# Patient Record
Sex: Female | Born: 1950 | Race: White | Hispanic: No | Marital: Married | Smoking: Never smoker
Health system: Southern US, Community
[De-identification: ages and names within clinical notes are randomized; demographics above are authoritative.]

## PROBLEM LIST (undated history)

## (undated) DIAGNOSIS — G4700 Insomnia, unspecified: Secondary | ICD-10-CM

## (undated) DIAGNOSIS — D649 Anemia, unspecified: Secondary | ICD-10-CM

## (undated) DIAGNOSIS — E7800 Pure hypercholesterolemia, unspecified: Secondary | ICD-10-CM

## (undated) DIAGNOSIS — Z780 Asymptomatic menopausal state: Secondary | ICD-10-CM

## (undated) DIAGNOSIS — F419 Anxiety disorder, unspecified: Secondary | ICD-10-CM

## (undated) DIAGNOSIS — E559 Vitamin D deficiency, unspecified: Secondary | ICD-10-CM

## (undated) DIAGNOSIS — M810 Age-related osteoporosis without current pathological fracture: Secondary | ICD-10-CM

## (undated) DIAGNOSIS — J302 Other seasonal allergic rhinitis: Secondary | ICD-10-CM

## (undated) HISTORY — DX: Vitamin D deficiency, unspecified: E55.9

## (undated) HISTORY — DX: Insomnia, unspecified: G47.00

## (undated) HISTORY — DX: Anxiety disorder, unspecified: F41.9

## (undated) HISTORY — DX: Asymptomatic menopausal state: Z78.0

## (undated) HISTORY — DX: Anemia, unspecified: D64.9

## (undated) HISTORY — PX: BLADDER SUSPENSION: SHX72

## (undated) HISTORY — DX: Age-related osteoporosis without current pathological fracture: M81.0

## (undated) HISTORY — PX: APPENDECTOMY: SHX54

## (undated) HISTORY — PX: BUNIONECTOMY: SHX129

## (undated) HISTORY — PX: TONSILLECTOMY: SUR1361

## (undated) HISTORY — DX: Other seasonal allergic rhinitis: J30.2

## (undated) HISTORY — DX: Pure hypercholesterolemia, unspecified: E78.00

---

## 1990-12-27 HISTORY — PX: ABDOMINAL HYSTERECTOMY: SHX81

## 1999-12-28 HISTORY — PX: LASIK: SHX215

## 2004-12-14 ENCOUNTER — Other Ambulatory Visit: Admission: RE | Admit: 2004-12-14 | Discharge: 2004-12-14 | Payer: Self-pay | Admitting: Family Medicine

## 2005-05-10 ENCOUNTER — Ambulatory Visit: Payer: Self-pay | Admitting: Internal Medicine

## 2005-06-02 ENCOUNTER — Ambulatory Visit (HOSPITAL_BASED_OUTPATIENT_CLINIC_OR_DEPARTMENT_OTHER): Admission: RE | Admit: 2005-06-02 | Discharge: 2005-06-02 | Payer: Self-pay | Admitting: Urology

## 2005-06-02 ENCOUNTER — Ambulatory Visit (HOSPITAL_COMMUNITY): Admission: RE | Admit: 2005-06-02 | Discharge: 2005-06-02 | Payer: Self-pay | Admitting: Urology

## 2005-08-06 ENCOUNTER — Ambulatory Visit: Payer: Self-pay | Admitting: Internal Medicine

## 2006-01-03 ENCOUNTER — Other Ambulatory Visit: Admission: RE | Admit: 2006-01-03 | Discharge: 2006-01-03 | Payer: Self-pay | Admitting: Family Medicine

## 2008-01-11 ENCOUNTER — Ambulatory Visit: Payer: Self-pay | Admitting: Internal Medicine

## 2008-01-18 ENCOUNTER — Encounter: Payer: Self-pay | Admitting: Internal Medicine

## 2008-01-18 ENCOUNTER — Ambulatory Visit: Payer: Self-pay | Admitting: Internal Medicine

## 2008-10-30 ENCOUNTER — Other Ambulatory Visit: Admission: RE | Admit: 2008-10-30 | Discharge: 2008-10-30 | Payer: Self-pay | Admitting: Family Medicine

## 2009-11-10 ENCOUNTER — Other Ambulatory Visit: Admission: RE | Admit: 2009-11-10 | Discharge: 2009-11-10 | Payer: Self-pay | Admitting: Family Medicine

## 2010-12-31 DIAGNOSIS — T7840XA Allergy, unspecified, initial encounter: Secondary | ICD-10-CM | POA: Insufficient documentation

## 2010-12-31 DIAGNOSIS — K921 Melena: Secondary | ICD-10-CM | POA: Insufficient documentation

## 2010-12-31 DIAGNOSIS — K449 Diaphragmatic hernia without obstruction or gangrene: Secondary | ICD-10-CM | POA: Insufficient documentation

## 2011-01-06 ENCOUNTER — Ambulatory Visit
Admission: RE | Admit: 2011-01-06 | Discharge: 2011-01-06 | Payer: Self-pay | Source: Home / Self Care | Attending: Internal Medicine | Admitting: Internal Medicine

## 2011-01-06 DIAGNOSIS — J301 Allergic rhinitis due to pollen: Secondary | ICD-10-CM | POA: Insufficient documentation

## 2011-01-19 ENCOUNTER — Ambulatory Visit
Admission: RE | Admit: 2011-01-19 | Discharge: 2011-01-19 | Payer: Self-pay | Source: Home / Self Care | Attending: Internal Medicine | Admitting: Internal Medicine

## 2011-01-20 ENCOUNTER — Other Ambulatory Visit: Payer: Self-pay | Admitting: Internal Medicine

## 2011-01-20 LAB — HEMOCCULT SLIDES (X 3 CARDS)
Fecal Occult Blood: NEGATIVE
OCCULT 3: NEGATIVE
OCCULT 4: NEGATIVE

## 2011-01-28 NOTE — Assessment & Plan Note (Addendum)
Summary: blood in stool--ch.    History of Present Illness Visit Type: Follow-up Visit Primary GI MD: Lina Sar MD Primary Provider: Dario Guardian, MD  Requesting Provider: na Chief Complaint: Patient had physical at primary care office and had a positive stool card test. Pt states that she feels fine and has not seen any blood and denies any GI complaints  History of Present Illness:   This is a 60 year old white female with positive Hemoccult cards on a home screening. She denies any rectal bleeding, abdominal pain or change in bowel habits. There is no family history of colon cancer. Her last colonoscopy in June 2006 was normal with no mention of hemorrhoids. She was evaluated in January 2009 for chest pain and was found to have a small hiatal hernia and minimal inflammation of the stomach on biopsies. Her symptoms were related to [facility] which she discontinued at the time. She still has occasional chest pain related to stress situations. She has been having yearly physical exams by Dr. Katrinka Blazing. There is no history of anemia.   GI Review of Systems      Denies abdominal pain, acid reflux, belching, bloating, chest pain, dysphagia with liquids, dysphagia with solids, heartburn, loss of appetite, nausea, vomiting, vomiting blood, weight loss, and  weight gain.      Reports heme positive stool.     Denies anal fissure, black tarry stools, change in bowel habit, constipation, diarrhea, diverticulosis, fecal incontinence, hemorrhoids, irritable bowel syndrome, jaundice, light color stool, liver problems, rectal bleeding, and  rectal pain.    Current Medications (verified): 1)  Clonazepam 0.5 Mg Tabs (Clonazepam) .... As Needed 2)  Ambien 5 Mg Tabs (Zolpidem Tartrate) .... One Tablet By Mouth Once Daily At Bedtime 3)  Multivitamins   Tabs (Multiple Vitamin) .... One Tablet By Mouth Once Daily 4)  Krill Oil 1000 Mg Caps (Krill Oil) .... One Tablet By Mouth Once Daily 5)  Calcium 1500 Mg Tabs  (Calcium Carbonate) .... One Tablet By Mouth Once Daily 6)  Finacea 15 % Gel (Azelaic Acid) .... As Directed  Allergies (verified): 1)  ! Codeine  Past History:  Past Medical History: ALLERGIC RHINITIS, SEASONAL (ICD-477.0) BLOOD IN STOOL (ICD-578.1) ALLERGY (ICD-995.3) HIATAL HERNIA (ICD-553.3)      Past Surgical History: Lasik Eye Surgery Placement of [employer] suprapubic sling, cystourethroscopy Bunionectomy Appendectomy (w/hysterectomy) Hysterectomy  Family History: Reviewed history from 12/31/2010 and no changes required. Family History of Heart Disease: Mother, Father No FH of Colon Cancer:  Social History: Alcohol Use - yes- 1 glass wine daily Illicit Drug Use - no Occupation: Museum/gallery curator Married  Review of Systems       The patient complains of allergy/sinus and sleeping problems.  The patient denies anemia, anxiety-new, arthritis/joint pain, back pain, blood in urine, breast changes/lumps, change in vision, confusion, cough, coughing up blood, depression-new, fainting, fatigue, fever, headaches-new, hearing problems, heart murmur, heart rhythm changes, itching, menstrual pain, muscle pains/cramps, night sweats, nosebleeds, pregnancy symptoms, shortness of breath, skin rash, sore throat, swelling of feet/legs, swollen lymph glands, thirst - excessive , urination - excessive , urination changes/pain, urine leakage, vision changes, and voice change.         .  Vital Signs:  Patient profile:   60 year old female Height:      68 inches Weight:      133 pounds BMI:     20.30 BSA:     1.72 Pulse rate:   60 / minute Pulse rhythm:  regular BP sitting:   110 / 64  (left arm) Cuff size:   regular  Vitals Entered By: Ok Anis CMA (January 06, 2011 10:49 AM)  Physical Exam  General:  Well developed, well nourished, no acute distress. Eyes:  PERRLA, no icterus. Mouth:  No deformity or lesions, dentition normal. Neck:  Supple; no masses or  thyromegaly. Lungs:  Clear throughout to auscultation. Heart:  Regular rate and rhythm; no murmurs, rubs,  or bruits. Abdomen:  Soft, nontender and nondistended. No masses, hepatosplenomegaly or hernias noted. Normal bowel sounds. Rectal:  rectal and anoscopic exam reveals normal perianal area and normal rectal sphincter tone as well as a normal anal canal. Normal rectal ampulla without evidence of proctitis or hemorrhoids. Stool is Hemoccult negative. Extremities:  No clubbing, cyanosis, edema or deformities noted. Skin:  Intact without significant lesions or rashes. Psych:  Alert and cooperative. Normal mood and affect.   Impression & Recommendations:  Problem # 1:  BLOOD IN STOOL (ICD-578.1) Patient had positive occult blood in stool on a home screening test. Patient is currently asymptomatic. I was unable to reproduce results on today's anoscopic exam. Patient is somewhat hesitant to schedule a colonoscopy. She is not anemic. We will repeat the Hemoccult cards first and if positive, she will need a colonoscopy  She would be willing to have her Hemoccults checked on a yearly basis until her next colonoscopy in June 2016. Orders: [facility] Hemoccult Cards #3 (take home) (Hem cards #3)  Patient Instructions: 1)  We have given you hemoccults to complete at home. Please follow the instructions given along with your cards.  2)  Copy sent to : Dr Merri Brunette 3)  The medication list was reviewed and reconciled.  All changed / newly prescribed medications were explained.  A complete medication list was provided to the patient / caregiver.  Appended Document: blood in stool--ch. left message on voicemail for patient to call back.

## 2011-01-28 NOTE — Procedures (Signed)
Summary: EGD   EGD  Procedure date:  01/18/2008  Findings:      Location: [facility]   Patient Name: [name], [name] MRN:  Procedure Procedures: Panendoscopy (EGD) CPT: 43235.    with biopsy(s)/brushing(s). CPT: D1846139.  Personnel: Endoscopist: Nasira Janusz L. Juanda Chance, MD.  Exam Location: Exam performed in Outpatient Clinic. Outpatient  Patient Consent: Procedure, Alternatives, Risks and Benefits discussed, consent obtained, from patient. Consent was obtained by the RN.  Indications Symptoms: Reflux symptoms for <1 yr,  Comments: symptoms started after Boniva given 8/08 and continued after  Boniva d/c"ed History  Current Medications: Patient is not currently taking Coumadin.  Pre-Exam Physical: Performed Jan 18, 2008  Entire physical exam was normal.  Comments: Pt. history reviewed/updated, physical exam performed prior to initiation of sedation?yes Exam Exam Info: Maximum depth of insertion Duodenum, intended Duodenum. Vocal cords visualized. Gastric retroflexion performed. Images taken. ASA Classification: I. Tolerance: good.  Sedation Meds: Patient assessed and found to be appropriate for moderate (conscious) sedation. Fentanyl 50 mcg. given IV. Versed 5 mg. given IV. Cetacaine Spray 2 sprays given aerosolized.  Monitoring: BP and pulse monitoring done. Oximetry used. Supplemental O2 given  Findings - HIATAL HERNIA: Diaphragm 36 cm from mouth. Z-line/GE Junction 34 cm from mouth. Regular, 2 cms. in length. sliding hiatal hernia. Biopsy/Hiatal Hernia taken.  ICD9: Hernia, Hiatal: 553.3. Normal: Body.  - Normal: Duodenal Apex to Duodenal 2nd Portion.   Assessment Normal examination.  Diagnoses: 553.3: Hernia, Hiatal.   Comments: small sliding hiatal hernia no endoscopic evidence of reflux esophagitis, s/p bx's of g-e junction Events  Unplanned Intervention: No unplanned interventions were required.  Unplanned Events: There were no  complications. Plans Medication(s): Await pathology. PPI: Esomeprazole/Nexium 40 mg QD, starting Jan 18, 2008  Carafate: 1gm BID, starting Jan 18, 2008   Patient Education: Patient given standard instructions for: Hiatal Hernia.  Disposition: After procedure patient sent to recovery. After recovery patient sent home.  Scheduling: Clinic Visit, to Aniketh Huberty L. Juanda Chance, MD, 4-6 week   This report was created from the original endoscopy report, which was reviewed and signed by the above listed endoscopist.

## 2011-01-28 NOTE — Procedures (Signed)
Summary: COLON   Colonoscopy  Procedure date:  08/06/2005  Findings:      Location:  [facility].   Patient Name: [name], [name] MRN:  Procedure Procedures: Colonoscopy CPT: 16109.  Personnel: Endoscopist: Ebonique Hallstrom L. Juanda Chance, MD.  Referred By: Dario Guardian, MD.  Exam Location: Exam performed in Outpatient Clinic. Outpatient  Patient Consent: Procedure, Alternatives, Risks and Benefits discussed, consent obtained, from patient. Consent was obtained by the RN.  Indications  Average Risk Screening Routine.  History  Current Medications: Patient is not currently taking Coumadin.  Pre-Exam Physical: Performed Aug 06, 2005. Entire physical exam was normal.  Exam Exam: Extent of exam reached: Cecum, extent intended: Cecum.  The cecum was identified by appendiceal orifice and IC valve. Colon retroflexion performed. Images taken. ASA Classification: I. Tolerance: good.  Monitoring: Pulse and BP monitoring, Oximetry used. Supplemental O2 given.  Colon Prep Used Miralax for colon prep. Prep results: good.  Sedation Meds: Patient assessed and found to be appropriate for moderate (conscious) sedation. Fentanyl 50 mcg. given IV. Versed 10 mg. given IV.  Findings - NORMAL EXAM: Cecum.  - NORMAL EXAM: Rectum.   Assessment Normal examination.  Comments: no polyps Events  Unplanned Interventions: No intervention was required.  Unplanned Events: There were no complications. Plans Patient Education: Patient given standard instructions for: Yearly hemoccult testing recommended. Patient instructed to get routine colonoscopy every 10 years.  Disposition: After procedure patient sent to recovery. After recovery patient sent home.    This report was created from the original endoscopy report, which was reviewed and signed by the above listed endoscopist.

## 2011-05-11 NOTE — Assessment & Plan Note (Signed)
[facility]                         GASTROENTEROLOGY OFFICE NOTE   NAME:[name], [name]                        MRN:          [id number]  DATE:01/11/2008                            DOB:          03-Jan-1951    [name] is a very nice 60 year old white female who we saw in the  past for colorectal screening.  Her colonoscopy in August of 2006 was  normal.  She is here today because of a several-month history of  substernal burning which started shortly after she took Boniva for  osteopenia.  She took only 1 pill in August of 2008 and subsequently  developed burning and indigestion for which she took Pepcid [facility] on a  daily basis.  Dr. Katrinka Blazing then increased it to twice a day and eventually  put her on Prilosec 20 mg for 2 weeks which really helped tremendously,  and symptoms almost subsided but they came back after she discontinued  the Prilosec.  Eating seems to make her feel better.  There is no  odynophagia or dysphagia.  Symptoms occur about 45 minutes after she  eats.  They do not get worse at night, although she is aware of some  chest discomfort at night.  She is currently on Prilosec 20 mg p.o.  b.i.d. with some continued discomfort in her chest.  She has a stressful  job as a Museum/gallery curator.  Her weight has been stable.  She drinks  1 glass of wine a day and 2 cups of coffee a day.  She does not take any  aspirin or NSAIDs.   MEDICATIONS:  1. Premarin 0.3 mg p.o. daily.  2. Prilosec 20 mg p.o. b.i.d.  3. Caltrate 600 mg b.i.d.   PAST MEDICAL HISTORY:  1. Seasonal allergies.  2. Hysterectomy in 1992.  3. Incidental appendectomy in 1992.  4. LASIK surgery on her eyes.  5. Bladder sling operation.  6. Bunionectomy.  7. Colonoscopy in 2006.   FAMILY HISTORY:  Negative for colon cancer, esophageal problems,  although her sister has gastroesophageal reflux disease.  Her mother and  father have heart disease.   SOCIAL HISTORY:  Married  with 2 children.  She works as a Engineer, technical sales.  She has a Master's degree.  She does not smoke and  drinks 1 glass of wine a day.   REVIEW OF SYSTEMS:  Positive for sleeping problems, anxiety.   PHYSICAL EXAMINATION:  VITAL SIGNS:  Blood pressure 110/78, pulse 72.  Weight 137 pounds.  GENERAL:  She was in no distress.  HEENT:  Her voice was normal.  Oral cavity normal.  Sclerae nonicteric.  NECK:  Supple, no adenopathy.  LUNGS:  Clear to auscultation.  CARDIAC:  Normal S1 and normal S2.  ABDOMEN:  Soft, scaphoid, relaxed, without tenderness.  I could not  reproduce any of her symptoms by pushing on her xiphoid or lower  abdomen.  RECTAL:  Examination not done.  EXTREMITIES:  No edema.   IMPRESSION:  A 60 year old white female with persistent substernal  burning suggestive of esophagitis, not responding to proton pump  inhibitors.  Rule out esophageal dysmotility, rule out Barrett's  esophagus, rule out hiatal hernia, rule out Helicobacter pylori  gastropathy.   PLAN:  1. Upper endoscopy scheduled.  If negative, proceed with barium      swallow and cine-esophagogram to assess the motility.  2. Switch to Nexium 40 mg daily.  3. Add Carafate slurry 1 gram p.o. q.i.d.  4. Consider adding antispasmodic such as Levsin sublingually or      nitroglycerin depending on the results of the motility studies.     Hedwig Morton. Juanda Chance, MD  Electronically Signed    DMB/MedQ  DD: 01/11/2008  DT: 01/11/2008  Job #: [id number]   cc:   Dario Guardian, M.D.

## 2011-05-14 NOTE — Op Note (Signed)
[name], [name]               ACCOUNT NO.:  [id number]   MEDICAL RECORD NO.:  [id number]          PATIENT TYPE:  AMB   LOCATION:  [facility]                         FACILITY:  [facility]   PHYSICIAN:  Ronald L. Earlene Plater, M.D.  DATE OF BIRTH:  [birth year]   DATE OF PROCEDURE:  06/02/2005  DATE OF DISCHARGE:                                 OPERATIVE REPORT   PREOPERATIVE DIAGNOSIS:  Urinary stress incontinence.   PROCEDURE:  Placement of [employer] suprapubic sling,  cystourethroscopy.   SURGEON:  Lucrezia Starch. Earlene Plater, M.D.   ANESTHESIA:  LMA.   ESTIMATED BLOOD LOSS:  Negligible.   TUBES:  16 French Foley vaginal pack.   COMPLICATIONS:  Needle puncture left side of the bladder.  Needle  repositioned.  To recovery room stable.   INDICATIONS FOR PROCEDURE:  [name] is a lovely 60 year old white female  who basically presented with urinary stress incontinence while walking and  standing.  She underwent a urodynamic evaluation which confirmed type 3  urinary incontinence.  She has attempted using anticholinergics and Kegel  exercises, but leakage has continued.  After understanding risks, benefits,  and alternatives has elected to proceed with placement of suprapubic sling.   DESCRIPTION OF PROCEDURE:  The patient was placed in the supine position  after proper LMA anesthesia was placed in the dorsal lithotomy position and  prepped and draped with Betadine in a sterile fashion.  The vaginal  submucosa was injected with approximately 5 mL of 1% Xylocaine with  epinephrine at the urethrovesical junction and approximately 1 cm incision  was made and flap created to the endopelvic fascia bilaterally.  A 16 French  Foley catheter had been inserted in the bladder and the bladder was drained.  Approximately one fingerbreadth superior and two fingerbreadths lateral to  the midline above the symphysis pubis, puncture holes were made, needles  were passed to finger pressure on either side  through the endopelvic fascia.  Cystourethroscopy was then performed and a slight tangential through and  through of the left lateral bladder wall was noted.  The needle was pulled  and repositioned so as to avoid this.  The TVT sling was then placed into  position.  Proper tension was adjusted.  Cystourethroscopy was performed  again and there was no perforation noted except the tiny punch holes which  appeared to be sealed and no other lesions were noted in the bladder or the  urethra utilizing both 12 and 70 degree lenses.  The vaginal mucosa was  closed with a running 2-0 Vicryl suture utilizing UR6 needle.  The sling was  cut at the skin edges and punch holes and sealed with Dermabond.  A 16  French Foley catheter was inserted and will be  maintained for approximately 5 days to allow healing of the bladder puncture  and a vaginal pack was placed with 2 inch vaginal pack with Bacitracin  ointment.  The patient tolerated the procedure well with no complications.  She was taken to the recovery room stable.       RLD/MEDQ  D:  06/02/2005  T:  06/02/2005  Job:  [id number]

## 2014-01-21 ENCOUNTER — Other Ambulatory Visit (HOSPITAL_COMMUNITY)
Admission: RE | Admit: 2014-01-21 | Discharge: 2014-01-21 | Disposition: A | Discharge status: Home / Self Care | Payer: Commercial Managed Care - PPO | Source: Ambulatory Visit | Attending: Family Medicine | Admitting: Family Medicine

## 2014-01-21 ENCOUNTER — Other Ambulatory Visit: Payer: Self-pay | Admitting: Family Medicine

## 2014-01-21 DIAGNOSIS — Z124 Encounter for screening for malignant neoplasm of cervix: Secondary | ICD-10-CM | POA: Insufficient documentation

## 2015-01-07 ENCOUNTER — Ambulatory Visit: Payer: Commercial Managed Care - PPO | Attending: Internal Medicine | Admitting: Physical Therapy

## 2015-01-07 DIAGNOSIS — R293 Abnormal posture: Secondary | ICD-10-CM | POA: Diagnosis not present

## 2015-01-07 DIAGNOSIS — M810 Age-related osteoporosis without current pathological fracture: Secondary | ICD-10-CM | POA: Insufficient documentation

## 2015-01-14 ENCOUNTER — Ambulatory Visit: Payer: Commercial Managed Care - PPO | Admitting: Physical Therapy

## 2015-01-20 ENCOUNTER — Ambulatory Visit: Payer: Commercial Managed Care - PPO | Admitting: Physical Therapy

## 2015-01-20 DIAGNOSIS — M810 Age-related osteoporosis without current pathological fracture: Secondary | ICD-10-CM | POA: Diagnosis not present

## 2015-02-04 ENCOUNTER — Ambulatory Visit: Payer: Commercial Managed Care - PPO | Attending: Internal Medicine | Admitting: Physical Therapy

## 2015-02-04 DIAGNOSIS — M810 Age-related osteoporosis without current pathological fracture: Secondary | ICD-10-CM | POA: Insufficient documentation

## 2015-02-04 DIAGNOSIS — R293 Abnormal posture: Secondary | ICD-10-CM | POA: Insufficient documentation

## 2015-02-10 ENCOUNTER — Encounter: Payer: Commercial Managed Care - PPO | Admitting: Physical Therapy

## 2015-06-02 ENCOUNTER — Encounter: Payer: Self-pay | Admitting: Internal Medicine

## 2015-07-18 ENCOUNTER — Ambulatory Visit (AMBULATORY_SURGERY_CENTER): Payer: Self-pay | Admitting: *Deleted

## 2015-07-18 VITALS — Ht 68.5 in | Wt 134.0 lb

## 2015-07-18 DIAGNOSIS — Z1211 Encounter for screening for malignant neoplasm of colon: Secondary | ICD-10-CM

## 2015-07-18 NOTE — Progress Notes (Signed)
Denies allergies to eggs or soy products. Denies complications with sedation or anesthesia. Denies O2 use. Denies use of diet or weight loss medications.  Emmi instructions given for colonoscopy.  

## 2015-07-30 ENCOUNTER — Encounter: Payer: Commercial Managed Care - PPO | Admitting: Internal Medicine

## 2015-08-08 ENCOUNTER — Encounter: Payer: Commercial Managed Care - PPO | Admitting: Internal Medicine

## 2015-10-02 ENCOUNTER — Encounter: Payer: Self-pay | Admitting: Gastroenterology

## 2015-11-19 ENCOUNTER — Encounter: Payer: Self-pay | Admitting: Gastroenterology

## 2015-12-12 ENCOUNTER — Ambulatory Visit (AMBULATORY_SURGERY_CENTER): Payer: Self-pay | Admitting: *Deleted

## 2015-12-12 VITALS — Ht 68.5 in | Wt 136.0 lb

## 2015-12-12 DIAGNOSIS — Z1211 Encounter for screening for malignant neoplasm of colon: Secondary | ICD-10-CM

## 2015-12-12 NOTE — Progress Notes (Signed)
No egg or soy allergy. No anesthesia problems.  No home O2.  No diet meds.  

## 2015-12-25 ENCOUNTER — Encounter: Payer: Commercial Managed Care - PPO | Admitting: Gastroenterology

## 2016-01-23 ENCOUNTER — Encounter: Payer: Self-pay | Admitting: Gastroenterology

## 2016-01-23 ENCOUNTER — Ambulatory Visit (AMBULATORY_SURGERY_CENTER): Payer: Commercial Managed Care - PPO | Admitting: Gastroenterology

## 2016-01-23 VITALS — BP 121/71 | HR 47 | Temp 97.8°F | Resp 20 | Ht 68.5 in | Wt 136.0 lb

## 2016-01-23 DIAGNOSIS — Z1211 Encounter for screening for malignant neoplasm of colon: Secondary | ICD-10-CM

## 2016-01-23 MED ORDER — SODIUM CHLORIDE 0.9 % IV SOLN: 500.0000 mL | INTRAVENOUS | Status: DC

## 2016-01-23 NOTE — Op Note (Signed)
[place]  [address] [place], [address]   COLONOSCOPY PROCEDURE REPORT  PATIENT: [name], [name]  MR#: [id number] BIRTHDATE: 1951/03/29 , 12  yrs. old GENDER: female ENDOSCOPIST: Harl Bowie, MD REFERRED [id number] Tamala Julian, M.D. PROCEDURE DATE:  01/23/2016 PROCEDURE:   Colonoscopy, screening First Screening Colonoscopy - Avg.  risk and is 50 yrs.  old or older - No.  Prior Negative Screening - Now for repeat screening. 10 or more years since last screening  History of Adenoma - Now for follow-up colonoscopy & has been > or = to 3 yrs.  N/A  Polyps removed today? No Recommend repeat exam, <10 yrs? No ASA CLASS:   Class II INDICATIONS:Screening for colonic neoplasia and Colorectal Neoplasm Risk Assessment for this procedure is average risk. MEDICATIONS: Propofol 300 mg IV  DESCRIPTION OF PROCEDURE:   After the risks benefits and alternatives of the procedure were thoroughly explained, informed consent was obtained.  The digital rectal exam revealed no abnormalities of the rectum.   The LB [id number]  endoscope was introduced through the anus and advanced to the cecum, which was identified by both the appendix and ileocecal valve. No adverse events experienced.   The quality of the prep was good.  The instrument was then slowly withdrawn as the colon was fully examined. Estimated blood loss is zero unless otherwise noted in this procedure report.   COLON FINDINGS: There was mild diverticulosis noted in the sigmoid colon.   The examination was otherwise normal.  Retroflexed views revealed internal Grade I hemorrhoids. The time to cecum = 7.8 Withdrawal time = 15.0   The scope was withdrawn and the procedure completed. COMPLICATIONS: There were no immediate complications.  ENDOSCOPIC IMPRESSION: 1.   There was mild diverticulosis noted in the sigmoid colon 2.   The examination was otherwise normal  RECOMMENDATIONS: You should continue to follow  colorectal cancer screening guidelines for "routine risk" patients with a repeat colonoscopy in 10 years. There is no need for FOBT (stool) testing for at least 5 years.  eSigned:  Harl Bowie, MD 01/23/2016 2:11 PM

## 2016-01-23 NOTE — Progress Notes (Signed)
Report to PACU, RN, vss, BBS= Clear.  

## 2016-01-23 NOTE — Patient Instructions (Signed)
YOU HAD AN ENDOSCOPIC PROCEDURE TODAY AT [facility]:   Refer to the procedure report that was given to you for any specific questions about what was found during the examination.  If the procedure report does not answer your questions, please call your gastroenterologist to clarify.  If you requested that your care partner not be given the details of your procedure findings, then the procedure report has been included in a sealed envelope for you to review at your convenience later.  YOU SHOULD EXPECT: Some feelings of bloating in the abdomen. Passage of more gas than usual.  Walking can help get rid of the air that was put into your GI tract during the procedure and reduce the bloating. If you had a lower endoscopy (such as a colonoscopy or flexible sigmoidoscopy) you may notice spotting of blood in your stool or on the toilet paper. If you underwent a bowel prep for your procedure, you may not have a normal bowel movement for a few days.  Please Note:  You might notice some irritation and congestion in your nose or some drainage.  This is from the oxygen used during your procedure.  There is no need for concern and it should clear up in a day or so.  SYMPTOMS TO REPORT IMMEDIATELY:   Following lower endoscopy (colonoscopy or flexible sigmoidoscopy):  Excessive amounts of blood in the stool  Significant tenderness or worsening of abdominal pains  Swelling of the abdomen that is new, acute  Fever of 100F or higher   For urgent or emergent issues, a gastroenterologist can be reached at any hour by calling [phone].   DIET: Your first meal following the procedure should be a small meal and then it is ok to progress to your normal diet. Heavy or fried foods are harder to digest and may make you feel nauseous or bloated.  Likewise, meals heavy in dairy and vegetables can increase bloating.  Drink plenty of fluids but you should avoid alcoholic beverages for 24  hours.  ACTIVITY:  You should plan to take it easy for the rest of today and you should NOT DRIVE or use heavy machinery until tomorrow (because of the sedation medicines used during the test).    FOLLOW UP: Our staff will call the number listed on your records the next business day following your procedure to check on you and address any questions or concerns that you may have regarding the information given to you following your procedure. If we do not reach you, we will leave a message.  However, if you are feeling well and you are not experiencing any problems, there is no need to return our call.  We will assume that you have returned to your regular daily activities without incident.  If any biopsies were taken you will be contacted by phone or by letter within the next 1-3 weeks.  Please call us at [phone] if you have not heard about the biopsies in 3 weeks.    SIGNATURES/CONFIDENTIALITY: You and/or your care partner have signed paperwork which will be entered into your electronic medical record.  These signatures attest to the fact that that the information above on your After Visit Summary has been reviewed and is understood.  Full responsibility of the confidentiality of this discharge information lies with you and/or your care-partner.  Diverticulosis, high fiber diet-handouts given  Repeat colonoscopy in 10 years 2017.

## 2016-01-26 ENCOUNTER — Telehealth: Payer: Self-pay

## 2016-01-26 NOTE — Telephone Encounter (Signed)
  Follow up Call-  Call back number 01/23/2016  Post procedure Call Back phone  # [phone]  Permission to leave phone message Yes     Patient was called for follow up after procedure on 01/23/2016. No answer at the number given for follow up. A message was left on the answering machine.

## 2017-05-26 ENCOUNTER — Ambulatory Visit: Payer: Self-pay

## 2017-05-26 ENCOUNTER — Ambulatory Visit (INDEPENDENT_AMBULATORY_CARE_PROVIDER_SITE_OTHER): Payer: Commercial Managed Care - PPO | Admitting: Family Medicine

## 2017-05-26 ENCOUNTER — Encounter: Payer: Self-pay | Admitting: Family Medicine

## 2017-05-26 VITALS — BP 128/80 | HR 56 | Ht 68.5 in | Wt 133.0 lb

## 2017-05-26 DIAGNOSIS — M7712 Lateral epicondylitis, left elbow: Secondary | ICD-10-CM | POA: Diagnosis not present

## 2017-05-26 DIAGNOSIS — M25522 Pain in left elbow: Secondary | ICD-10-CM | POA: Diagnosis not present

## 2017-05-26 DIAGNOSIS — G8929 Other chronic pain: Secondary | ICD-10-CM | POA: Diagnosis not present

## 2017-05-26 MED ORDER — VITAMIN D (ERGOCALCIFEROL) 1.25 MG (50000 UNIT) PO CAPS: 50000.0000 [IU] | ORAL_CAPSULE | ORAL | 0 refills | Status: DC

## 2017-05-26 MED ORDER — NITROGLYCERIN 0.2 MG/HR TD PT24: MEDICATED_PATCH | TRANSDERMAL | 1 refills | Status: DC

## 2017-05-26 NOTE — Patient Instructions (Signed)
Good to see you.  Ice 20 minutes 2 times daily. Usually after activity and before bed. Exercises 3 times a week.  Wrist brace day and night for 1 week then nightly for 2 weeks. Wear it with working out.  No lifting overhand at all Once weekly vitamin D for 12 weeks.  Stop the daily  pennsaid pinkie amount topically 2 times daily as needed.  Nitroglycerin Protocol   Apply 1/4 nitroglycerin patch to affected area daily.  Change position of patch within the affected area every 24 hours.  You may experience a headache during the first 1-2 weeks of using the patch, these should subside.  If you experience headaches after beginning nitroglycerin patch treatment, you may take your preferred over the counter pain reliever.  Another side effect of the nitroglycerin patch is skin irritation or rash related to patch adhesive.  Please notify our office if you develop more severe headaches or rash, and stop the patch.  Tendon healing with nitroglycerin patch may require 12 to 24 weeks depending on the extent of injury.  Men should not use if taking Viagra, Cialis, or Levitra.   Do not use if you have migraines or rosacea.   See me again in 4 weeks and we will make sure better.

## 2017-05-26 NOTE — Progress Notes (Signed)
[name] [address] [place], [place] [address] Phone: [phone] Subjective:    I'm seeing this patient by the request  of:  Rita Ada, MD   CC: Left elbow pain  [id number]  [name] is a 66 y.o. female coming in with complaint of left elbow pain. Patient has had it for quite some time. Patient likes to work on a regular basis secondary to her history of osteoporosis. Patient states that unfortunately when she was lifting weights started having lateral elbow pain. Transitioned bodies and it seemed to get better. Patient states though that now it is hurting Pilates and even daily activities. Describes it as a dull aching sensation. Worse with movements of the blue. Sometimes can be sharp and sometimes can be nagging. Does respond to anti-inflammatories. Denies any weakness. No numbness. No associated neck pain     Past Medical History:  Diagnosis Date  . Anemia   . Anxiety   . Osteoporosis   . Seasonal allergies    Past Surgical History:  Procedure Laterality Date  . 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 BLADDER SUSPENSION    . BUNIONECTOMY Right   . LASIK Bilateral 2001   Social History   Social History  . Marital status: Married    Spouse name: N/A  . Number of children: N/A  . Years of education: N/A   Social History Main Topics  . Smoking status: Never Smoker  . Smokeless tobacco: Never Used  . Alcohol use 4.2 oz/week    7 Glasses of wine per week  . Drug use: No  . Sexual activity: Not on file   Other Topics Concern  . Not on file   Social History Narrative  . No narrative on file   Allergies  Allergen Reactions  . Codeine     REACTION: vomiting   Family History  Problem Relation Age of Onset  . Colon cancer Neg Hx     Past medical history, social, surgical and family history all reviewed in electronic medical record.  No pertanent information unless stated  regarding to the chief complaint.   Review of Systems:Review of systems updated and as accurate as of 05/26/17  No headache, visual changes, nausea, vomiting, diarrhea, constipation, dizziness, abdominal pain, skin rash, fevers, chills, night sweats, weight loss, swollen lymph nodes, body aches, joint swelling, muscle aches, chest pain, shortness of breath, mood changes.   Objective  There were no vitals taken for this visit. Systems examined below as of 05/26/17   General: No apparent distress alert and oriented x3 mood and affect normal, dressed appropriately.  HEENT: Pupils equal, extraocular movements intact  Respiratory: Patient's speak in full sentences and does not appear short of breath  Cardiovascular: No lower extremity edema, non tender, no erythema  Skin: Warm dry intact with no signs of infection or rash on extremities or on axial skeleton.  Abdomen: Soft nontender  Neuro: Cranial nerves II through XII are intact, neurovascularly intact in all extremities with 2+ DTRs and 2+ pulses.  Lymph: No lymphadenopathy of posterior or anterior cervical chain or axillae bilaterally.  Gait normal with good balance and coordination.  MSK:  Non tender with full range of motion and good stability and symmetric strength and tone of shoulders,  wrist, hip, knee and ankles bilaterally.  Elbow: Left Unremarkable to inspection. Range of motion full pronation, supination, flexion, extension. Strength is full to all of the  above directions Stable to varus, valgus stress. Negative moving valgus stress test. Tender over the lateral epicondylar region worsening pain with extension of the wrist Ulnar nerve does not sublux. Negative cubital tunnel Tinel's.  Musculoskeletal ultrasound was performed and interpreted by Charlann Boxer D.O.   Elbow:  Lateral epicondyle and common extensor tendon origin visualized. Patient does have intersubstance tear with increasing Doppler flow. Radial head unremarkable  and located in annular ligament Medial epicondyle and common flexor tendon origin visualized.  No edema, effusions, or avulsions seen. Ulnar nerve in cubital tunnel unremarkable. Olecranon and triceps insertion visualized and unremarkable without edema, effusion, or avulsion.  No signs olecranon bursitis. Power doppler signal normal.  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      Note: This dictation was prepared with Dragon dictation along with smaller phrase technology. Any transcriptional errors that result from this process are unintentional.

## 2017-05-26 NOTE — Assessment & Plan Note (Signed)
Patient does have what appears be a lateral epicondylar injury with a mid substance common extensor tendon tear. Patient will be started on 90 glycerin and warned of potential side effects. We discussed icing regimen. Once weekly vitamin D. We discussed which activities doing which ones to avoid. Wrist brace given today. Follow-up again in 4 weeks

## 2017-06-30 ENCOUNTER — Ambulatory Visit (INDEPENDENT_AMBULATORY_CARE_PROVIDER_SITE_OTHER): Payer: Commercial Managed Care - PPO | Admitting: Family Medicine

## 2017-06-30 ENCOUNTER — Encounter: Payer: Self-pay | Admitting: Family Medicine

## 2017-06-30 DIAGNOSIS — M7712 Lateral epicondylitis, left elbow: Secondary | ICD-10-CM | POA: Diagnosis not present

## 2017-06-30 NOTE — Assessment & Plan Note (Signed)
3 mild improvement. Discussed with patient at great length. Encourage her to continue the conservative therapy as well as continuing the nitroglycerin. We'll make no significant changes with her making some mild improvement. Did discuss the possibility of formal physical therapy or injection which patient declined. We'll discuss that again in follow-up in 6 weeks.

## 2017-06-30 NOTE — Progress Notes (Signed)
[name] [address] [place], [place] [address] Phone: [phone] Subjective:    I'm seeing this patient by the request  of:  Carol Ada, MD   CC: Left elbow pain f/u  [id number]  [name] is a 67 y.o. female coming in with complaint of left elbow pain. Found to have more of a common extensor tear of the left elbow. Patient was started on nitroglycerin as well as vitamin D supplementation. Patient states that she is making some improvement. States that daily activities seem to be somewhat better. Patient still states that if she tries to increase her activity though unfortunately seems to worsen fairly quickly. Has not been doing certain workups that she likes to donor basis secondary to the pain.    Past Medical History:  Diagnosis Date  . Anemia   . Anxiety   . Osteoporosis   . Seasonal allergies    Past Surgical History:  Procedure Laterality Date  . 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 BLADDER SUSPENSION    . BUNIONECTOMY Right   . LASIK Bilateral 2001   Social History   Social History  . Marital status: Married    Spouse name: N/A  . Number of children: N/A  . Years of education: N/A   Social History Main Topics  . Smoking status: Never Smoker  . Smokeless tobacco: Never Used  . Alcohol use 4.2 oz/week    7 Glasses of wine per week  . Drug use: No  . Sexual activity: Not Asked   Other Topics Concern  . None   Social History Narrative  . None   Allergies  Allergen Reactions  . Codeine     REACTION: vomiting   Family History  Problem Relation Age of Onset  . Colon cancer Neg Hx     Past medical history, social, surgical and family history all reviewed in electronic medical record.  No pertanent information unless stated regarding to the chief complaint.   Review of Systems: No headache, visual changes, nausea, vomiting, diarrhea, constipation, dizziness,  abdominal pain, skin rash, fevers, chills, night sweats, weight loss, swollen lymph nodes, body aches, joint swelling, muscle aches, chest pain, shortness of breath, mood changes.     Objective  Blood pressure 110/74, pulse (!) 54, height 5' 8.5" (1.74 m), weight 134 lb (60.8 kg), SpO2 98 %.   Systems examined below as of 06/30/17 General: NAD A&O x3 mood, affect normal  HEENT: Pupils equal, extraocular movements intact no nystagmus Respiratory: not short of breath at rest or with speaking Cardiovascular: No lower extremity edema, non tender Skin: Warm dry intact with no signs of infection or rash on extremities or on axial skeleton. Abdomen: Soft nontender, no masses Neuro: Cranial nerves  intact, neurovascularly intact in all extremities with 2+ DTRs and 2+ pulses. Lymph: No lymphadenopathy appreciated today  Gait normal with good balance and coordination.  MSK: Non tender with full range of motion and good stability and symmetric strength and tone of shoulders,  wrist,  knee hips and ankles bilaterally.   Elbow: Left Unremarkable to inspection. Range of motion full pronation, supination, flexion, extension. Strength is full to all of the above directions Stable to varus, valgus stress. Negative moving valgus stress test. More tenderness to palpation over the lateral epicondylar region. Ulnar nerve does not sublux. Negative cubital tunnel Tinel's.      Impression and Recommendations:  Note: This dictation was prepared with Dragon dictation along with smaller phrase technology. Any transcriptional errors that result from this process are unintentional.

## 2017-06-30 NOTE — Patient Instructions (Signed)
Good to see you  I thinke we are making progress OK to ice a little less  Our exercises 3-5 times a week on both arms.  Keep up with the patches and the vitamin D OK to start yoga or pilates 1 time a week for 2 weeks then 2 times a week for 2 weeks.  Keep watching on how you lift.  Eat within 30 minutes of working out.  See e again in 4-6 weeks.

## 2017-07-05 ENCOUNTER — Encounter: Payer: Self-pay | Admitting: Family Medicine

## 2017-08-06 NOTE — Progress Notes (Signed)
[name] [place], [place] [address] Phone: [phone] Subjective:    I'm seeing this patient by the request  of:  Carol Ada, MD   CC: Left elbow pain f/u  [id number]  [name] is a 66 y.o. female coming in with complaint of left elbow pain. Found to have more of a common extensor tear of the left elbow. Patient is on medical history and is on vitamin D.  Started on nitro and states 40% better. Still having some pain, increasing activity slowly.  No new symptoms.     Past Medical History:  Diagnosis Date  . Anemia   . Anxiety   . Osteoporosis   . Seasonal allergies    Past Surgical History:  Procedure Laterality Date  . 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 BLADDER SUSPENSION    . BUNIONECTOMY Right   . LASIK Bilateral 2001   Social History   Social History  . Marital status: Married    Spouse name: N/A  . Number of children: N/A  . Years of education: N/A   Social History Main Topics  . Smoking status: Never Smoker  . Smokeless tobacco: Never Used  . Alcohol use 4.2 oz/week    7 Glasses of wine per week  . Drug use: No  . Sexual activity: Not Asked   Other Topics Concern  . None   Social History Narrative  . None   Allergies  Allergen Reactions  . Codeine     REACTION: vomiting   Family History  Problem Relation Age of Onset  . Colon cancer Neg Hx     Past medical history, social, surgical and family history all reviewed in electronic medical record.  No pertanent information unless stated regarding to the chief complaint.   Review of Systems: No headache, visual changes, nausea, vomiting, diarrhea, constipation, dizziness, abdominal pain, skin rash, fevers, chills, night sweats, weight loss, swollen lymph nodes, body aches, joint swelling, muscle aches, chest pain, shortness of breath, mood changes.     Objective  Blood pressure 102/64, pulse  84, weight 133 lb (60.3 kg).   Systems examined below as of 08/08/17 General: NAD A&O x3 mood, affect normal  HEENT: Pupils equal, extraocular movements intact no nystagmus Respiratory: not short of breath at rest or with speaking Cardiovascular: No lower extremity edema, non tender Skin: Warm dry intact with no signs of infection or rash on extremities or on axial skeleton. Abdomen: Soft nontender, no masses Neuro: Cranial nerves  intact, neurovascularly intact in all extremities with 2+ DTRs and 2+ pulses. Lymph: No lymphadenopathy appreciated today  Gait normal with good balance and coordination.  MSK: Non tender with full range of motion and good stability and symmetric strength and tone of shoulders,  wrist,  knee hips and ankles bilaterally.   Elbow: Left Unremarkable to inspection. Range of motion full pronation, supination, flexion, extension. Strength is full to all of the above directions Stable to varus, valgus stress. Negative moving valgus stress test. Very mild tenderness to palpation over the lateral epicondylar region. Improvement from previous exam Ulnar nerve does not sublux. Negative cubital tunnel Tinel's. Contralateral elbow unremarkable  Musculoskeletal ultrasound was performed and interpreted by Charlann Boxer D.O.   Elbow:  Lateral epicondyle and common extensor tendon origin visualized.  Patient still has an articular side tear but doesn't seem to be smaller than previous exam. Hypoechoic changes it is significantly less.  Patient does have neovascularization  IMPRESSION:  Improvement in lateral epicondylitis and tear     Impression and Recommendations:          Note: This dictation was prepared with Dragon dictation along with smaller phrase technology. Any transcriptional errors that result from this process are unintentional.

## 2017-08-08 ENCOUNTER — Ambulatory Visit (INDEPENDENT_AMBULATORY_CARE_PROVIDER_SITE_OTHER): Payer: Commercial Managed Care - PPO | Admitting: Family Medicine

## 2017-08-08 ENCOUNTER — Encounter: Payer: Self-pay | Admitting: Family Medicine

## 2017-08-08 ENCOUNTER — Ambulatory Visit: Payer: Self-pay

## 2017-08-08 VITALS — BP 102/64 | HR 84 | Wt 133.0 lb

## 2017-08-08 DIAGNOSIS — M25522 Pain in left elbow: Secondary | ICD-10-CM

## 2017-08-08 DIAGNOSIS — M7712 Lateral epicondylitis, left elbow: Secondary | ICD-10-CM | POA: Diagnosis not present

## 2017-08-08 NOTE — Patient Instructions (Signed)
Good to see you  Ice is your friend but only as you need it.  Keep doing what you are doing exercises 3-4 times a week See me again in 6-8 weeks.

## 2017-08-08 NOTE — Assessment & Plan Note (Signed)
Making some progress. Common extensor tear is lessening. Discuss continuing conservative therapy at this time. Patient declined any type of injection including PRP. Patient continued to be active otherwise. Follow-up again in 6-8 weeks

## 2017-08-09 ENCOUNTER — Other Ambulatory Visit: Payer: Self-pay | Admitting: Family Medicine

## 2017-08-09 NOTE — Telephone Encounter (Signed)
Refill done.  

## 2017-10-03 ENCOUNTER — Ambulatory Visit: Payer: Commercial Managed Care - PPO | Admitting: Family Medicine

## 2017-10-13 ENCOUNTER — Ambulatory Visit (INDEPENDENT_AMBULATORY_CARE_PROVIDER_SITE_OTHER): Payer: Commercial Managed Care - PPO | Admitting: Family Medicine

## 2017-10-13 ENCOUNTER — Encounter: Payer: Self-pay | Admitting: Family Medicine

## 2017-10-13 DIAGNOSIS — M7712 Lateral epicondylitis, left elbow: Secondary | ICD-10-CM

## 2017-10-13 MED ORDER — GABAPENTIN 100 MG PO CAPS: 200.0000 mg | ORAL_CAPSULE | Freq: Every day | ORAL | 3 refills | Status: DC

## 2017-10-13 NOTE — Patient Instructions (Addendum)
Good to see you  Ice 20 minutes 2 times daily. Usually after activity and before bed. Keep it up ! Gabapentin 200mg  at night  In 5 days then stop the brace as well  Continue the nitro  See me again in 4 weeks

## 2017-10-13 NOTE — Assessment & Plan Note (Signed)
Appears to be improving. Patient has work with a Community education officer. Continue the nitroglycerin. 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 Objectively improvement. Subjectively patient was having difficulty before but now little more optimistic. We discussed other possible treatment options.

## 2017-10-13 NOTE — Progress Notes (Signed)
[name] [address] [place],  [address] Phone: [phone] Subjective:     CC: Left elbow follow-up  [id number]  [name] is a 66 y.o. female coming in with complaint of left elbow pain. Patient was found to have a common extensor tendon tear. Started on education and vitamin D. Was feeling somewhat better but is unfortunately having worsening pain. Patient states that unfortunate dull throbbing aching pain that seems to be worse at night.     Past Medical History:  Diagnosis Date  . Anemia   . Anxiety   . Osteoporosis   . Seasonal allergies    Past Surgical History:  Procedure Laterality Date  . 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 BLADDER SUSPENSION    . BUNIONECTOMY Right   . LASIK Bilateral 2001   Social History   Social History  . Marital status: Married    Spouse name: N/A  . Number of children: N/A  . Years of education: N/A   Social History Main Topics  . Smoking status: Never Smoker  . Smokeless tobacco: Never Used  . Alcohol use 4.2 oz/week    7 Glasses of wine per week  . Drug use: No  . Sexual activity: Not Asked   Other Topics Concern  . None   Social History Narrative  . None   Allergies  Allergen Reactions  . Codeine     REACTION: vomiting   Family History  Problem Relation Age of Onset  . Colon cancer Neg Hx      Past medical history, social, surgical and family history all reviewed in electronic medical record.  No pertanent information unless stated regarding to the chief complaint.   Review of Systems:Review of systems updated and as accurate as of 10/13/17  No headache, visual changes, nausea, vomiting, diarrhea, constipation, dizziness, abdominal pain, skin rash, fevers, chills, night sweats, weight loss, swollen lymph nodes, body aches, joint swelling, chest pain, shortness of breath, mood changes. Positive muscle aches  Objective    Blood pressure 120/80, pulse (!) 55, height 5\' 8"  (1.727 m), weight 130 lb (59 kg), SpO2 97 %. Systems examined below as of 10/13/17   General: No apparent distress alert and oriented x3 mood and affect normal, dressed appropriately.  HEENT: Pupils equal, extraocular movements intact  Respiratory: Patient's speak in full sentences and does not appear short of breath  Cardiovascular: No lower extremity edema, non tender, no erythema  Skin: Warm dry intact with no signs of infection or rash on extremities or on axial skeleton.  Abdomen: Soft nontender  Neuro: Cranial nerves II through XII are intact, neurovascularly intact in all extremities with 2+ DTRs and 2+ pulses.  Lymph: No lymphadenopathy of posterior or anterior cervical chain or axillae bilaterally.  Gait normal with good balance and coordination.  MSK:  Non tender with full range of motion and good stability and symmetric strength and tone of shoulders, , wrist, hip, knee and ankles bilaterally.  Elbow: Left Unremarkable to inspection. Range of motion full pronation, supination, flexion, extension. Strength is full to all of the above directions Stable to varus, valgus stress. Negative moving valgus stress test. Mild pain over the lateral epicondylar region. More pain over the radial nerve on the dorsal aspect of the forearm Ulnar nerve does not sublux. Negative cubital tunnel Tinel's.  Musculoskeletal ultrasound was performed and interpreted by Charlann Boxer D.O.   Elbow:  Lateral epicondyle  and common extensor tendon origin visualized. Significant decrease in hypoechoic changes as well as now in calcific changes. Distal radial nerve shows significant irritation increasing Doppler flow  IMPRESSION:  Interval healing of the lateral patella colitis and the common extensor tendon tear    Impression and Recommendations:     This case required medical decision making of moderate complexity.      Note: This dictation was  prepared with Dragon dictation along with smaller phrase technology. Any transcriptional errors that result from this process are unintentional.

## 2017-10-18 ENCOUNTER — Encounter: Payer: Self-pay | Admitting: Family Medicine

## 2017-10-18 ENCOUNTER — Telehealth: Payer: Self-pay | Admitting: Family Medicine

## 2017-10-18 NOTE — Telephone Encounter (Signed)
Pt called and was told to take GABAPENTIN for 5 nights, she is still in pain and would like to know if she can still continue to take it, please advise and call back

## 2017-11-10 ENCOUNTER — Ambulatory Visit (INDEPENDENT_AMBULATORY_CARE_PROVIDER_SITE_OTHER): Payer: Commercial Managed Care - PPO | Admitting: Family Medicine

## 2017-11-10 ENCOUNTER — Ambulatory Visit
Admission: RE | Admit: 2017-11-10 | Discharge: 2017-11-10 | Disposition: A | Discharge status: Home / Self Care | Payer: Commercial Managed Care - PPO | Source: Ambulatory Visit | Attending: Family Medicine | Admitting: Family Medicine

## 2017-11-10 ENCOUNTER — Encounter: Payer: Self-pay | Admitting: Family Medicine

## 2017-11-10 VITALS — BP 138/84 | HR 55 | Ht 68.0 in | Wt 136.0 lb

## 2017-11-10 DIAGNOSIS — M7712 Lateral epicondylitis, left elbow: Secondary | ICD-10-CM

## 2017-11-10 DIAGNOSIS — M25522 Pain in left elbow: Secondary | ICD-10-CM | POA: Diagnosis not present

## 2017-11-10 NOTE — Patient Instructions (Signed)
Good to see you  Ice is your friend still  Duexis up to 3 times a day but 1 time should be fine Stop the nitro  Finish the vitamin D Send message at least in 3 weeks to tell me how you are doing Next step would be injection in the nerve and then maybe PRP.  You will be fine ! Increase activity slowly as we discussed.

## 2017-11-10 NOTE — Assessment & Plan Note (Signed)
Doing relatively well.  Discussed testing with him.  We discussed which activities to do which wants to avoid.  Patient will start increasing activity slowly over the course the next several days. This patient is well she can follow-up as needed.  Worsening symptoms we will consider possible injection of the radial nerve as well as PRP of the lateral epicondylar region.

## 2017-11-10 NOTE — Progress Notes (Signed)
[name] [place], [place] [address] Phone: [phone] Subjective:    I'm seeing this patient by the request  of:    CC: Left elbow pain follow-up  [id number]  Hampton Cost is a 66 y.o. female coming in with complaint of left elbow pain.  Patient was found to have more of a common extensor tendon tear.  He has been treated for a long time now.  Started with nitroglycerin and the vitamin D.  Reviewed.  Was having more of a nerve radial nerve irritation and started on gabapentin.  Patient states that this helped and has discontinued the medication.  Patient is feeling 90% better but still has not started to increase her activity at this time.  Patient is concerned that when she starts increasing activity the pain will come back immediately.       Past Medical History:  Diagnosis Date  . Anemia   . Anxiety   . Osteoporosis   . Seasonal allergies    Past Surgical History:  Procedure Laterality Date  . 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 BLADDER SUSPENSION    . BUNIONECTOMY Right   . LASIK Bilateral 2001   Social History   Socioeconomic History  . Marital status: Married    Spouse name: None  . Number of children: None  . Years of education: None  . Highest education level: None  Social Needs  . Financial resource strain: None  . Food insecurity - worry: None  . Food insecurity - inability: None  . Transportation needs - medical: None  . Transportation needs - non-medical: None  Occupational History  . None  Tobacco Use  . Smoking status: Never Smoker  . Smokeless tobacco: Never Used  Substance and Sexual Activity  . Alcohol use: Yes    Alcohol/week: 4.2 oz    Types: 7 Glasses of wine per week  . Drug use: No  . Sexual activity: None  Other Topics Concern  . None  Social History Narrative  . None   Allergies  Allergen Reactions  . Codeine     REACTION: vomiting     Family History  Problem Relation Age of Onset  . Colon cancer Neg Hx      Past medical history, social, surgical and family history all reviewed in electronic medical record.  No pertanent information unless stated regarding to the chief complaint.   Review of Systems:Review of systems updated and as accurate as of 11/10/17  No headache, visual changes, nausea, vomiting, diarrhea, constipation, dizziness, abdominal pain, skin rash, fevers, chills, night sweats, weight loss, swollen lymph nodes, body aches, joint swelling, muscle aches, chest pain, shortness of breath, mood changes.   Objective  Blood pressure 138/84, pulse (!) 55, height 5\' 8"  (1.727 m), weight 136 lb (61.7 kg), SpO2 98 %. Systems examined below as of 11/10/17   General: No apparent distress alert and oriented x3 mood and affect normal, dressed appropriately.  HEENT: Pupils equal, extraocular movements intact  Respiratory: Patient's speak in full sentences and does not appear short of breath  Cardiovascular: No lower extremity edema, non tender, no erythema  Skin: Warm dry intact with no signs of infection or rash on extremities or on axial skeleton.  Abdomen: Soft nontender  Neuro: Cranial nerves II through XII are intact, neurovascularly intact in all extremities with 2+ DTRs and 2+ pulses.  Lymph: No lymphadenopathy of posterior or anterior  cervical chain or axillae bilaterally.  Gait normal with good balance and coordination.  MSK:  Non tender with full range of motion and good stability and symmetric strength and tone of shoulders,  wrist, hip, knee and ankles bilaterally.  Elbow: Unremarkable to inspection. Range of motion full pronation, supination, flexion, extension. Strength is full to all of the above directions Stable to varus, valgus stress. Negative moving valgus stress test. Palpation still over the lateral epicondylar region still worse with resisted extension.  Mild pain over the insertion of the  triceps as well. Ulnar nerve does not sublux. Negative cubital tunnel Tinel's. Contralateral elbow unremarkable    Impression and Recommendations:     This case required medical decision making of moderate complexity.      Note: This dictation was prepared with Dragon dictation along with smaller phrase technology. Any transcriptional errors that result from this process are unintentional.

## 2017-11-20 ENCOUNTER — Encounter: Payer: Self-pay | Admitting: Family Medicine

## 2017-11-21 MED ORDER — IBUPROFEN-FAMOTIDINE 800-26.6 MG PO TABS: 1.0000 | ORAL_TABLET | Freq: Three times a day (TID) | ORAL | 3 refills | Status: DC | PRN

## 2018-05-09 ENCOUNTER — Encounter: Payer: Self-pay | Admitting: Podiatry

## 2018-05-09 ENCOUNTER — Ambulatory Visit: Payer: Commercial Managed Care - PPO | Admitting: Podiatry

## 2018-05-09 ENCOUNTER — Ambulatory Visit (INDEPENDENT_AMBULATORY_CARE_PROVIDER_SITE_OTHER): Payer: Commercial Managed Care - PPO

## 2018-05-09 DIAGNOSIS — G8929 Other chronic pain: Secondary | ICD-10-CM

## 2018-05-09 DIAGNOSIS — M79673 Pain in unspecified foot: Secondary | ICD-10-CM

## 2018-05-09 DIAGNOSIS — M7750 Other enthesopathy of unspecified foot: Secondary | ICD-10-CM

## 2018-05-09 DIAGNOSIS — M779 Enthesopathy, unspecified: Secondary | ICD-10-CM | POA: Diagnosis not present

## 2018-05-09 DIAGNOSIS — M792 Neuralgia and neuritis, unspecified: Secondary | ICD-10-CM | POA: Diagnosis not present

## 2018-05-09 DIAGNOSIS — M19079 Primary osteoarthritis, unspecified ankle and foot: Secondary | ICD-10-CM

## 2018-05-09 DIAGNOSIS — M21619 Bunion of unspecified foot: Secondary | ICD-10-CM | POA: Diagnosis not present

## 2018-05-09 MED ORDER — MELOXICAM 15 MG PO TABS: 15.0000 mg | ORAL_TABLET | Freq: Every day | ORAL | 0 refills | Status: DC

## 2018-05-09 NOTE — Progress Notes (Signed)
Subjective:    Patient ID: [name], female    DOB: [birth year], 67 y.o.   MRN: [id number]  HPI 67 year old female presents the office today for concerns of bilateral foot pain in the left foot worse than the right.  She states that she gets pain in the inflamed feeling to the ball of her left foot more than the right elbow is intermittent burning pain to the ball of her foot and going up to the lateral aspect of the foot on the left side.  She states this started last year around the summertime she started going barefoot and since then she has had some discomfort to her feet.  She states that she has an uncomfortable feeling to her feet at nighttime.  She has a history of bunion surgery to the right foot but no surgery to the left side.  She has tried changing shoes with good arch support which helps some but does not eliminate her symptoms.  She has no other concerns today.   Review of Systems  All other systems reviewed and are negative.  Past Medical History:  Diagnosis Date  . Anemia   . Anxiety   . Osteoporosis   . Seasonal allergies     Past Surgical History:  Procedure Laterality Date  . 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 BLADDER SUSPENSION    . BUNIONECTOMY Right   . LASIK Bilateral 2001     Current Outpatient Medications:  .  cetirizine (ZYRTEC) 10 MG tablet, Take 10 mg by mouth as needed for allergies. Reported on 01/23/2016, Disp: , Rfl:  .  clonazePAM (KLONOPIN) 0.5 MG tablet, Take 0.5 mg by mouth 2 (two) times daily as needed for anxiety., Disp: , Rfl:  .  gabapentin (NEURONTIN) 100 MG capsule, Take 2 capsules (200 mg total) by mouth at bedtime. (Patient not taking: Reported on 11/10/2017), Disp: 60 capsule, Rfl: 3 .  Ibuprofen-Famotidine 800-26.6 MG TABS, Take 1 tablet by mouth 3 (three) times daily as needed., Disp: 90 tablet, Rfl: 3 .  meloxicam (MOBIC) 15 MG tablet, Take 1 tablet (15 mg total) by mouth daily.,  Disp: 30 tablet, Rfl: 0 .  Multiple Vitamins-Minerals (OSTEOPRIME ULTRA PO), Take by mouth., Disp: , Rfl:  .  nitroGLYCERIN (NITRODUR - DOSED IN MG/24 HR) 0.2 mg/hr patch, 1/4 patch daily (Patient not taking: Reported on 11/10/2017), Disp: 30 patch, Rfl: 1 .  Vitamin D, Ergocalciferol, (DRISDOL) 50000 units CAPS capsule, take 1 capsule by mouth every 7 days, Disp: 12 capsule, Rfl: 0  Allergies  Allergen Reactions  . Codeine     REACTION: vomiting         Objective:   Physical Exam  General: AAO x3, NAD  Dermatological: Scar from the prior surgery in the right foot is well-healed.  Skin is warm, dry and supple bilateral. Nails x 10 are well manicured; remaining integument appears unremarkable at this time. There are no open sores, no preulcerative lesions, no rash or signs of infection present.  Vascular: Dorsalis Pedis artery and Posterior Tibial artery pedal pulses are 2/4 bilateral with immedate capillary fill time.  There is no pain with calf compression, swelling, warmth, erythema.   Neruologic: Grossly intact via light touch bilateral. Vibratory intact via tuning fork bilateral. Protective threshold with Semmes Wienstein monofilament intact to all pedal sites bilateral.  Negative Tinel sign.  Musculoskeletal: There is mild diffuse tenderness on the left foot submetatarsal 1 through 5 and  there does appear to be mild increase in swelling laterally compared to contralateral extremity but there is no erythema or increase in warmth.  There is no area pinpoint bony tenderness or pain to vibratory sensation.  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  There is a decreased range of motion of the right first MPJ.  There is a rectus foot type upon weightbearing.  Achilles tendon, extensor, flexor tendons appear to be intact.  There is no palpable neuroma identified today.  There is no significant edema otherwise there is no erythema.  Muscular strength 5/5 in all groups tested bilateral.  Gait:  Unassisted, Nonantalgic.      Assessment & Plan:  67 year old female with metatarsalgia, capsulitis, HAV left foot -Treatment options discussed including all alternatives, risks, and complications -Etiology of symptoms were discussed -X-rays were obtained and reviewed.  Mild arthritic changes present on the left first MPJ and also decreased joint space in the right first MPJ.  Otherwise there is no evidence of acute fracture or stress fracture identified today. -We discussed treating this for an inflammatory standpoint but also to work on her overall structure and help take pressure off the metatarsals.  Discussed an anti-inflammatory she has Duexis at home that she can continue.  Also I do think she will benefit more from a custom molded orthotic.  She is going to proceed with this.  I had Betha evaluate her today and she was molded for orthotics.  Continue with supportive shoes.  Can consider physical therapy or an MRI if symptoms continue.  Trula Slade DPM

## 2018-05-12 ENCOUNTER — Telehealth: Payer: Self-pay | Admitting: Podiatry

## 2018-05-12 NOTE — Telephone Encounter (Signed)
I called pt and left a message that I have tried contacting her insurance company several times but I am having difficulty talking to someone to get the coverage. They were to be faxing it to me so  I can try and figure it out. But I told pt I would try again on Monday and call her back

## 2018-05-15 NOTE — Telephone Encounter (Signed)
Pt called back and said she called her insurance company and they said the cost of the orthotics would go towards the deductible so pt is wanting to only do the dress pair for now.

## 2018-05-15 NOTE — Telephone Encounter (Signed)
[employer] and they are covered at 100% no copay/no deductible and no maximum.  Lvm for pt to call to discuss this morning.  Pt returned call and wants to proceed with orthotics.Since covered and no limitations patient would like to get a dress pair and full length pair.

## 2018-06-01 ENCOUNTER — Other Ambulatory Visit: Payer: Commercial Managed Care - PPO | Admitting: Orthotics

## 2018-06-07 ENCOUNTER — Other Ambulatory Visit: Payer: Commercial Managed Care - PPO | Admitting: Orthotics

## 2018-06-08 ENCOUNTER — Ambulatory Visit (INDEPENDENT_AMBULATORY_CARE_PROVIDER_SITE_OTHER): Payer: Commercial Managed Care - PPO | Admitting: Orthotics

## 2018-06-08 DIAGNOSIS — M19079 Primary osteoarthritis, unspecified ankle and foot: Secondary | ICD-10-CM

## 2018-06-08 DIAGNOSIS — M779 Enthesopathy, unspecified: Secondary | ICD-10-CM

## 2018-06-08 NOTE — Progress Notes (Signed)
patietn not happy with f/o; they need to hug arch more and have more cushioning.  Sent back to [facility] for reworking.

## 2018-06-16 ENCOUNTER — Telehealth: Payer: Self-pay | Admitting: Podiatry

## 2018-06-16 NOTE — Telephone Encounter (Signed)
Pt left a message regarding getting a bill for the orthotics. She said the first pair were not right and [name] is remaking them.  I returned call and left a message that the orthotics where billed when she was first cast for them and I am aware she has not gotten them yet. I explained that she will not be billed again and to call if any further questions.

## 2018-06-22 ENCOUNTER — Ambulatory Visit (INDEPENDENT_AMBULATORY_CARE_PROVIDER_SITE_OTHER): Payer: Commercial Managed Care - PPO | Admitting: Orthotics

## 2018-06-22 DIAGNOSIS — G8929 Other chronic pain: Secondary | ICD-10-CM

## 2018-06-22 DIAGNOSIS — M21619 Bunion of unspecified foot: Secondary | ICD-10-CM

## 2018-06-22 DIAGNOSIS — M79673 Pain in unspecified foot: Secondary | ICD-10-CM

## 2018-06-22 NOTE — Progress Notes (Signed)
Gregary Signs p/up adjusted f/o and seemed pleased with them.  Went over extensively break in period.

## 2018-07-25 ENCOUNTER — Telehealth: Payer: Self-pay | Admitting: Family Medicine

## 2018-07-25 NOTE — Telephone Encounter (Signed)
Pt scheduled 8.5.19.

## 2018-07-25 NOTE — Telephone Encounter (Signed)
Copied from [place] [id number]. Topic: Inquiry >> Jul 25, 2018  1:42 PM Bea Graff, NT wrote: Reason for CRM: Pt returning a call to [employer]. Pt states to please call back after 4pm. She will be available between 4-5pm.

## 2018-07-31 ENCOUNTER — Ambulatory Visit: Payer: Commercial Managed Care - PPO | Admitting: Family Medicine

## 2018-09-25 NOTE — Progress Notes (Signed)
[name] [name], [place] [address] Phone: [phone] Subjective:    I [name] am serving as a Education administrator for Dr. Hulan Saas.  CC: Bilateral foot pain  SVX:[id number]  [name] is a 67 y.o. female coming in with complaint of bilateral foot pain. Has used orthotics that were painful. Has done dry needling in the hip and feet. Podiatrist gave her a heel lift and orthotics. States that her feet give her a lot of sensory information and that they feel like they are on fire. Dry needle work was done across the metatarsal pads. Shoes are painful. Has been doing low impact activities. Believes that her hips are uneven. No numbness and tingling noted. Has xrays and didn't see anything wrong with her bones.   Onset- Chronic (early summer) Location- Left is worse than right. consistent lateral pain Duration-  Character- bone pain Aggravating factors-  Reliving factors-  Therapies tried- Ibuprofen, tennis ball, exercise, ice (5 minutes at a time a couple times of the day) hamstring curls, figure 4 Severity-9 out of 10 worsening   Patient did have x-rays taken back in May 2019.  Independently visualized by me.  Found to have very mild breakdown with some mild arthritic changes but nothing severe  Past Medical History:  Diagnosis Date  . Anemia   . Anxiety   . Osteoporosis   . Seasonal allergies    Past Surgical History:  Procedure Laterality Date  . 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 BLADDER SUSPENSION    . BUNIONECTOMY Right   . LASIK Bilateral 2001   Social History   Socioeconomic History  . Marital status: Married    Spouse name: Not on file  . Number of children: Not on file  . Years of education: Not on file  . Highest education level: Not on file  Occupational History  . Not on file  Social Needs  . Financial resource strain: Not on file  . Food insecurity:    Worry:  Not on file    Inability: Not on file  . Transportation needs:    Medical: Not on file    Non-medical: Not on file  Tobacco Use  . Smoking status: Never Smoker  . Smokeless tobacco: Never Used  Substance and Sexual Activity  . Alcohol use: Yes    Alcohol/week: 7.0 standard drinks    Types: 7 Glasses of wine per week  . Drug use: No  . Sexual activity: Not on file  Lifestyle  . Physical activity:    Days per week: Not on file    Minutes per session: Not on file  . Stress: Not on file  Relationships  . Social connections:    Talks on phone: Not on file    Gets together: Not on file    Attends religious service: Not on file    Active member of club or organization: Not on file    Attends meetings of clubs or organizations: Not on file    Relationship status: Not on file  Other Topics Concern  . Not on file  Social History Narrative  . Not on file   Allergies  Allergen Reactions  . Codeine     REACTION: vomiting   Family History  Problem Relation Age of Onset  . Colon cancer Neg Hx      Current Outpatient Medications (Cardiovascular):  .  nitroGLYCERIN (NITRODUR - DOSED IN  MG/24 HR) 0.2 mg/hr patch, 1/4 patch daily  Current Outpatient Medications (Respiratory):  .  cetirizine (ZYRTEC) 10 MG tablet, Take 10 mg by mouth as needed for allergies. Reported on 01/23/2016  Current Outpatient Medications (Analgesics):  Marland Kitchen  Ibuprofen-Famotidine 800-26.6 MG TABS, Take 1 tablet by mouth 3 (three) times daily as needed. (Patient not taking: Reported on 09/26/2018) .  meloxicam (MOBIC) 15 MG tablet, Take 1 tablet (15 mg total) by mouth daily. (Patient not taking: Reported on 09/26/2018)   Current Outpatient Medications (Other):  .  gabapentin (NEURONTIN) 100 MG capsule, Take 2 capsules (200 mg total) by mouth at bedtime. .  Multiple Vitamins-Minerals (OSTEOPRIME ULTRA PO), Take by mouth. .  clonazePAM (KLONOPIN) 0.5 MG tablet, Take 0.5 mg by mouth 2 (two) times daily as needed for  anxiety. .  Vitamin D, Ergocalciferol, (DRISDOL) 50000 units CAPS capsule, take 1 capsule by mouth every 7 days (Patient not taking: Reported on 09/26/2018)    Past medical history, social, surgical and family history all reviewed in electronic medical record.  No pertanent information unless stated regarding to the chief complaint.   Review of Systems:  No headache, visual changes, nausea, vomiting, diarrhea, constipation, dizziness, abdominal pain, skin rash, fevers, chills, night sweats, weight loss, swollen lymph nodes, body aches, joint swelling,  chest pain, shortness of breath, mood changes.  Positive muscle aches  Objective  Blood pressure (!) 154/84, pulse 64, height 5\' 8"  (1.727 m), weight 137 lb (62.1 kg), SpO2 95 %.    General: No apparent distress alert and oriented x3 mood and affect normal, dressed appropriately.  HEENT: Pupils equal, extraocular movements intact  Respiratory: Patient's speak in full sentences and does not appear short of breath  Cardiovascular: No lower extremity edema, non tender, no erythema  Skin: Warm dry intact with no signs of infection or rash on extremities or on axial skeleton.  Abdomen: Soft nontender  Neuro: Cranial nerves II through XII are intact, neurovascularly intact in all extremities with 2+ DTRs and 2+ pulses.  Lymph: No lymphadenopathy of posterior or anterior cervical chain or axillae bilaterally.  Gait normal with good balance and coordination.  MSK:  Non tender with full range of motion and good stability and symmetric strength and tone of shoulders, elbows, wrist, hip, knee and ankles bilaterally.  Foot exam shows the patient has very mild over pronation of the hindfoot.  Some mild breakdown of the transverse arch left greater than right.  Patient does appear to have very small amount of peripheral neuropathy noted around the toes bilaterally.    Impression and Recommendations:     This case required medical decision making of  moderate complexity. The above documentation has been reviewed and is accurate and complete Lyndal Pulley, DO       Note: This dictation was prepared with Dragon dictation along with smaller phrase technology. Any transcriptional errors that result from this process are unintentional.

## 2018-09-26 ENCOUNTER — Encounter: Payer: Self-pay | Admitting: Family Medicine

## 2018-09-26 ENCOUNTER — Other Ambulatory Visit: Payer: Self-pay

## 2018-09-26 ENCOUNTER — Other Ambulatory Visit (INDEPENDENT_AMBULATORY_CARE_PROVIDER_SITE_OTHER): Payer: PPO

## 2018-09-26 ENCOUNTER — Ambulatory Visit (INDEPENDENT_AMBULATORY_CARE_PROVIDER_SITE_OTHER): Payer: PPO | Admitting: Family Medicine

## 2018-09-26 DIAGNOSIS — M79671 Pain in right foot: Secondary | ICD-10-CM | POA: Insufficient documentation

## 2018-09-26 DIAGNOSIS — M79672 Pain in left foot: Secondary | ICD-10-CM | POA: Diagnosis not present

## 2018-09-26 DIAGNOSIS — M2550 Pain in unspecified joint: Secondary | ICD-10-CM

## 2018-09-26 LAB — URIC ACID: Uric Acid, Serum: 3.1 mg/dL (ref 2.4–7.0)

## 2018-09-26 LAB — CBC WITH DIFFERENTIAL/PLATELET
BASOS ABS: 0.1 10*3/uL (ref 0.0–0.1)
BASOS PCT: 0.9 % (ref 0.0–3.0)
EOS ABS: 0.1 10*3/uL (ref 0.0–0.7)
Eosinophils Relative: 2 % (ref 0.0–5.0)
HCT: 37.8 % (ref 36.0–46.0)
Hemoglobin: 12.8 g/dL (ref 12.0–15.0)
Lymphocytes Relative: 44 % (ref 12.0–46.0)
Lymphs Abs: 2.9 10*3/uL (ref 0.7–4.0)
MCHC: 33.9 g/dL (ref 30.0–36.0)
MCV: 92.6 fl (ref 78.0–100.0)
MONO ABS: 0.6 10*3/uL (ref 0.1–1.0)
Monocytes Relative: 8.9 % (ref 3.0–12.0)
NEUTROS ABS: 2.9 10*3/uL (ref 1.4–7.7)
Neutrophils Relative %: 44.2 % (ref 43.0–77.0)
PLATELETS: 216 10*3/uL (ref 150.0–400.0)
RBC: 4.08 Mil/uL (ref 3.87–5.11)
RDW: 12.9 % (ref 11.5–15.5)
WBC: 6.6 10*3/uL (ref 4.0–10.5)

## 2018-09-26 LAB — COMPREHENSIVE METABOLIC PANEL
ALT: 17 U/L (ref 0–35)
AST: 28 U/L (ref 0–37)
Albumin: 4.6 g/dL (ref 3.5–5.2)
Alkaline Phosphatase: 52 U/L (ref 39–117)
BILIRUBIN TOTAL: 0.4 mg/dL (ref 0.2–1.2)
BUN: 21 mg/dL (ref 6–23)
CALCIUM: 9.4 mg/dL (ref 8.4–10.5)
CHLORIDE: 102 meq/L (ref 96–112)
CO2: 30 meq/L (ref 19–32)
CREATININE: 0.83 mg/dL (ref 0.40–1.20)
GFR: 73.34 mL/min (ref >60.00)
GLUCOSE: 82 mg/dL (ref 70–99)
Potassium: 4.1 mEq/L (ref 3.5–5.1)
Sodium: 138 mEq/L (ref 135–145)
Total Protein: 7.2 g/dL (ref 6.0–8.3)

## 2018-09-26 LAB — TSH: TSH: 1.88 u[IU]/mL (ref 0.35–4.50)

## 2018-09-26 LAB — FERRITIN: FERRITIN: 44.5 ng/mL (ref 10.0–291.0)

## 2018-09-26 LAB — SEDIMENTATION RATE: Sed Rate: 10 mm/hr (ref 0–30)

## 2018-09-26 LAB — FOLATE: Folate: 22.5 ng/mL (ref >5.9)

## 2018-09-26 LAB — IBC PANEL
Iron: 62 ug/dL (ref 42–145)
Saturation Ratios: 17.7 % — ABNORMAL LOW (ref 20.0–50.0)
Transferrin: 250 mg/dL (ref 212.0–360.0)

## 2018-09-26 LAB — VITAMIN B12: VITAMIN B 12: 827 pg/mL (ref 211–911)

## 2018-09-26 MED ORDER — GABAPENTIN 100 MG PO CAPS: 200.0000 mg | ORAL_CAPSULE | Freq: Every day | ORAL | 3 refills | Status: DC

## 2018-09-26 NOTE — Patient Instructions (Signed)
Good to see you  Labs today downstairs  Stay active  Keep heating it and try socks at night  Gabapentin 200mg  at night  See me again in 3-4 weeks

## 2018-09-26 NOTE — Assessment & Plan Note (Signed)
Bilateral foot pain.  Mild breakdown of the longitudinal and transverse arch bilaterally left greater than right.  Patient does have tenderness to palpation noted.  Patient has what appears to be possible peripheral neuropathy also noted.  Discussed avoiding certain activities and icing regimen.  Discussed proper shoes.  Laboratory work-up to further evaluate for any causes.  He has had this quite some time.  Differential also includes lumbar radiculopathy and refilled gabapentin to see how patient does.  Follow-up again in 4 weeks

## 2018-09-27 ENCOUNTER — Encounter: Payer: Self-pay | Admitting: Family Medicine

## 2018-09-28 LAB — VITAMIN D 1,25 DIHYDROXY
VITAMIN D 1, 25 (OH) TOTAL: 31 pg/mL (ref 18–72)
VITAMIN D3 1, 25 (OH): 31 pg/mL
Vitamin D2 1, 25 (OH)2: <8 pg/mL

## 2018-09-28 LAB — PTH, INTACT AND CALCIUM
Calcium: 8.7 mg/dL (ref 8.6–10.4)
PTH: 28 pg/mL (ref 14–64)

## 2018-09-28 LAB — CALCIUM, IONIZED: CALCIUM ION: 5.01 mg/dL (ref 4.8–5.6)

## 2018-10-05 ENCOUNTER — Encounter: Payer: Self-pay | Admitting: Family Medicine

## 2018-10-18 NOTE — Progress Notes (Signed)
[name] [address], [place] [address] Phone: [phone] Subjective:    I [name] am serving as a Education administrator for Dr. Hulan Saas.     CC: Bilateral foot pain  PXT:[id number]  [name] is a 67 y.o. female coming in with complaint of bilateral foot pain. States she's been taking gabapentin but it made her feel bad. Is now taking 1 pill due to the reoccurrence of pain after decreasing the dose. Pain is only resolved with the gabapentin. Has been trying new exercises.  Patient is concerned because this does not feel right.  Patient's laboratory work-up did show that patient did have mild iron deficiency and she has been taking it 3 times a week.  Patient has been trying to avoid being barefoot, doing the exercises.  Would state that the only thing that does seem to make a significant difference is the gabapentin but is unable to tolerate the suggested dose secondary to the side effects.     Past Medical History:  Diagnosis Date  . Anemia   . Anxiety   . Osteoporosis   . Seasonal allergies    Past Surgical History:  Procedure Laterality Date  . 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 BLADDER SUSPENSION    . BUNIONECTOMY Right   . LASIK Bilateral 2001   Social History   Socioeconomic History  . Marital status: Married    Spouse name: Not on file  . Number of children: Not on file  . Years of education: Not on file  . Highest education level: Not on file  Occupational History  . Not on file  Social Needs  . Financial resource strain: Not on file  . Food insecurity:    Worry: Not on file    Inability: Not on file  . Transportation needs:    Medical: Not on file    Non-medical: Not on file  Tobacco Use  . Smoking status: Never Smoker  . Smokeless tobacco: Never Used  Substance and Sexual Activity  . Alcohol use: Yes    Alcohol/week: 7.0 standard drinks    Types: 7 Glasses of  wine per week  . Drug use: No  . Sexual activity: Not on file  Lifestyle  . Physical activity:    Days per week: Not on file    Minutes per session: Not on file  . Stress: Not on file  Relationships  . Social connections:    Talks on phone: Not on file    Gets together: Not on file    Attends religious service: Not on file    Active member of club or organization: Not on file    Attends meetings of clubs or organizations: Not on file    Relationship status: Not on file  Other Topics Concern  . Not on file  Social History Narrative  . Not on file   Allergies  Allergen Reactions  . Codeine     REACTION: vomiting   Family History  Problem Relation Age of Onset  . Colon cancer Neg Hx      Current Outpatient Medications (Cardiovascular):  .  nitroGLYCERIN (NITRODUR - DOSED IN MG/24 HR) 0.2 mg/hr patch, 1/4 patch daily  Current Outpatient Medications (Respiratory):  .  cetirizine (ZYRTEC) 10 MG tablet, Take 10 mg by mouth as needed for allergies. Reported on 01/23/2016  Current Outpatient Medications (Analgesics):  Marland Kitchen  Ibuprofen-Famotidine 800-26.6 MG TABS, Take  1 tablet by mouth 3 (three) times daily as needed. .  meloxicam (MOBIC) 15 MG tablet, Take 1 tablet (15 mg total) by mouth daily.   Current Outpatient Medications (Other):  .  clonazePAM (KLONOPIN) 0.5 MG tablet, Take 0.5 mg by mouth 2 (two) times daily as needed for anxiety. .  gabapentin (NEURONTIN) 100 MG capsule, Take 2 capsules (200 mg total) by mouth at bedtime. .  Multiple Vitamins-Minerals (OSTEOPRIME ULTRA PO), Take by mouth. .  Vitamin D, Ergocalciferol, (DRISDOL) 50000 units CAPS capsule, take 1 capsule by mouth every 7 days    Past medical history, social, surgical and family history all reviewed in electronic medical record.  No pertanent information unless stated regarding to the chief complaint.   Review of Systems:  No headache, visual changes, nausea, vomiting, diarrhea, constipation, dizziness,  abdominal pain, skin rash, fevers, chills, night sweats, weight loss, swollen lymph nodes, body aches, joint swelling, muscle aches, chest pain, shortness of breath, mood changes.   Objective  Blood pressure 130/90, pulse 62, height 5\' 8"  (1.727 m), weight 136 lb (61.7 kg), SpO2 96 %.    General: No apparent distress alert and oriented x3 mood and affect normal, dressed appropriately.  HEENT: Pupils equal, extraocular movements intact  Respiratory: Patient's speak in full sentences and does not appear short of breath  Cardiovascular: No lower extremity edema, non tender, no erythema  Skin: Warm dry intact with no signs of infection or rash on extremities or on axial skeleton.  Abdomen: Soft nontender  Neuro: Cranial nerves II through XII are intact, neurovascularly intact in all extremities with 2+ DTRs and 2+ pulses.  Lymph: No lymphadenopathy of posterior or anterior cervical chain or axillae bilaterally.  Gait normal with good balance and coordination.  MSK:  Non tender with full range of motion and good stability and symmetric strength and tone of shoulders, elbows, wrist, hip, knee and ankles bilaterally.  Patient back exam does show that patient does have some tightness.  Patient does have worsening foot discomfort with straight leg test bilaterally.  Full strength though noted.  Patient does have full small spotty areas of the foot that seems to have some neuropathy noted.  No true weakness.  Deep tendon reflexes intact   Impression and Recommendations:     This case required medical decision making of moderate complexity. The above documentation has been reviewed and is accurate and complete Lyndal Pulley, DO       Note: This dictation was prepared with Dragon dictation along with smaller phrase technology. Any transcriptional errors that result from this process are unintentional.

## 2018-10-19 ENCOUNTER — Encounter: Payer: Self-pay | Admitting: Family Medicine

## 2018-10-19 ENCOUNTER — Ambulatory Visit: Payer: PPO | Admitting: Family Medicine

## 2018-10-19 ENCOUNTER — Ambulatory Visit (INDEPENDENT_AMBULATORY_CARE_PROVIDER_SITE_OTHER)
Admission: RE | Admit: 2018-10-19 | Discharge: 2018-10-19 | Disposition: A | Discharge status: Home / Self Care | Payer: PPO | Source: Ambulatory Visit | Attending: Family Medicine | Admitting: Family Medicine

## 2018-10-19 VITALS — BP 130/90 | HR 62 | Ht 68.0 in | Wt 136.0 lb

## 2018-10-19 DIAGNOSIS — M545 Low back pain: Secondary | ICD-10-CM | POA: Diagnosis not present

## 2018-10-19 DIAGNOSIS — M79672 Pain in left foot: Secondary | ICD-10-CM | POA: Diagnosis not present

## 2018-10-19 DIAGNOSIS — M549 Dorsalgia, unspecified: Secondary | ICD-10-CM

## 2018-10-19 DIAGNOSIS — M79671 Pain in right foot: Secondary | ICD-10-CM | POA: Diagnosis not present

## 2018-10-19 NOTE — Assessment & Plan Note (Signed)
Patient did have some worsening symptoms with the straight leg test.  Concern for potential lumbar radicular symptoms that could be contributing.  Patient's other health seems to be very well controlled and there is no other signs that could be potentially giving this to her.  This could be an injury to the plantar nerve and we did discuss the potential for nerve conduction study.  We are going to discussed that again in great length of the MRI of the lumbar spine is normal.  We are ruling out anything that could be causing compression such as a small herniated disc, foraminal narrowing, or potential cystic mass.  Patient does not have any fevers chills or any abnormal weight loss and laboratory work-up was otherwise relatively normal.  Has meloxicam for breakthrough.  Following up with me again in 4 weeks. Spent  25 minutes with patient face-to-face and had greater than 50% of counseling including as described above in assessment and plan.

## 2018-10-19 NOTE — Patient Instructions (Signed)
Good to see you  Xray of the back today  MRI of the back ordered and call [phone] and you can schedule.  Continue the low dose of gabapentin If all normal we will get the nerve conductions study  Have an appointment set up with me in 4-6 weeks

## 2018-10-22 ENCOUNTER — Encounter: Payer: Self-pay | Admitting: Family Medicine

## 2018-10-27 ENCOUNTER — Ambulatory Visit
Admission: RE | Admit: 2018-10-27 | Discharge: 2018-10-27 | Disposition: A | Discharge status: Home / Self Care | Payer: PPO | Source: Ambulatory Visit | Attending: Family Medicine | Admitting: Family Medicine

## 2018-10-27 DIAGNOSIS — M549 Dorsalgia, unspecified: Secondary | ICD-10-CM

## 2018-10-27 DIAGNOSIS — M5416 Radiculopathy, lumbar region: Secondary | ICD-10-CM | POA: Diagnosis not present

## 2018-10-30 ENCOUNTER — Encounter: Payer: Self-pay | Admitting: Family Medicine

## 2018-10-30 DIAGNOSIS — M5416 Radiculopathy, lumbar region: Secondary | ICD-10-CM

## 2018-11-13 DIAGNOSIS — D2262 Melanocytic nevi of left upper limb, including shoulder: Secondary | ICD-10-CM | POA: Diagnosis not present

## 2018-11-13 DIAGNOSIS — L718 Other rosacea: Secondary | ICD-10-CM | POA: Diagnosis not present

## 2018-11-13 DIAGNOSIS — D2261 Melanocytic nevi of right upper limb, including shoulder: Secondary | ICD-10-CM | POA: Diagnosis not present

## 2018-11-13 DIAGNOSIS — L821 Other seborrheic keratosis: Secondary | ICD-10-CM | POA: Diagnosis not present

## 2018-11-13 DIAGNOSIS — D2371 Other benign neoplasm of skin of right lower limb, including hip: Secondary | ICD-10-CM | POA: Diagnosis not present

## 2018-11-13 DIAGNOSIS — D224 Melanocytic nevi of scalp and neck: Secondary | ICD-10-CM | POA: Diagnosis not present

## 2018-11-16 ENCOUNTER — Encounter

## 2018-11-16 ENCOUNTER — Ambulatory Visit (INDEPENDENT_AMBULATORY_CARE_PROVIDER_SITE_OTHER): Payer: PPO | Admitting: Family Medicine

## 2018-11-16 ENCOUNTER — Encounter: Payer: Self-pay | Admitting: Family Medicine

## 2018-11-16 VITALS — BP 140/80 | HR 54 | Ht 68.0 in | Wt 135.0 lb

## 2018-11-16 DIAGNOSIS — M48062 Spinal stenosis, lumbar region with neurogenic claudication: Secondary | ICD-10-CM | POA: Diagnosis not present

## 2018-11-16 DIAGNOSIS — G8929 Other chronic pain: Secondary | ICD-10-CM | POA: Diagnosis not present

## 2018-11-16 DIAGNOSIS — M549 Dorsalgia, unspecified: Secondary | ICD-10-CM | POA: Diagnosis not present

## 2018-11-16 DIAGNOSIS — M48061 Spinal stenosis, lumbar region without neurogenic claudication: Secondary | ICD-10-CM | POA: Insufficient documentation

## 2018-11-16 NOTE — Patient Instructions (Signed)
Good to see you  This will be a little trial and error We will get an epidural at L4/5 and call [phone] to schedule.l  Keep doing everything else PT will help  See me again in 3 weeks AFTER the epidural and lets see how you are doing  Happy holidays!

## 2018-11-16 NOTE — Progress Notes (Signed)
[name] [place], [place] [address] Phone: [phone] Subjective:      I [name] am serving as a Education administrator for Dr. Hulan Saas.   CC: Foot pain and back pain follow-up  EVO:[id number]  [name] is a 67 y.o. female coming in with complaint of feet pain. Painful. Feeling better than they have in a while. Wants to talk about MRI results. Still taking gabapentin and duexis at night that helps with pain.  Patient back pain is moderate in nature she states.  Nothing that stops her from activity.  MRI was independently visualized by me did show mild spinal stenosis from L3-L5.     Past Medical History:  Diagnosis Date  . Anemia   . Anxiety   . Osteoporosis   . Seasonal allergies    Past Surgical History:  Procedure Laterality Date  . 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 BLADDER SUSPENSION    . BUNIONECTOMY Right   . LASIK Bilateral 2001   Social History   Socioeconomic History  . Marital status: Married    Spouse name: Not on file  . Number of children: Not on file  . Years of education: Not on file  . Highest education level: Not on file  Occupational History  . Not on file  Social Needs  . Financial resource strain: Not on file  . Food insecurity:    Worry: Not on file    Inability: Not on file  . Transportation needs:    Medical: Not on file    Non-medical: Not on file  Tobacco Use  . Smoking status: Never Smoker  . Smokeless tobacco: Never Used  Substance and Sexual Activity  . Alcohol use: Yes    Alcohol/week: 7.0 standard drinks    Types: 7 Glasses of wine per week  . Drug use: No  . Sexual activity: Not on file  Lifestyle  . Physical activity:    Days per week: Not on file    Minutes per session: Not on file  . Stress: Not on file  Relationships  . Social connections:    Talks on phone: Not on file    Gets together: Not on file    Attends religious  service: Not on file    Active member of club or organization: Not on file    Attends meetings of clubs or organizations: Not on file    Relationship status: Not on file  Other Topics Concern  . Not on file  Social History Narrative  . Not on file   Allergies  Allergen Reactions  . Codeine     REACTION: vomiting   Family History  Problem Relation Age of Onset  . Colon cancer Neg Hx      Current Outpatient Medications (Cardiovascular):  .  nitroGLYCERIN (NITRODUR - DOSED IN MG/24 HR) 0.2 mg/hr patch, 1/4 patch daily  Current Outpatient Medications (Respiratory):  .  cetirizine (ZYRTEC) 10 MG tablet, Take 10 mg by mouth as needed for allergies. Reported on 01/23/2016  Current Outpatient Medications (Analgesics):  Marland Kitchen  Ibuprofen-Famotidine 800-26.6 MG TABS, Take 1 tablet by mouth 3 (three) times daily as needed. .  meloxicam (MOBIC) 15 MG tablet, Take 1 tablet (15 mg total) by mouth daily.   Current Outpatient Medications (Other):  .  clonazePAM (KLONOPIN) 0.5 MG tablet, Take 0.5 mg by mouth 2 (two) times daily as needed for anxiety. Marland Kitchen  gabapentin (NEURONTIN) 100 MG capsule, Take 2 capsules (200 mg total) by mouth at bedtime. .  Multiple Vitamins-Minerals (OSTEOPRIME ULTRA PO), Take by mouth. .  Vitamin D, Ergocalciferol, (DRISDOL) 50000 units CAPS capsule, take 1 capsule by mouth every 7 days    Past medical history, social, surgical and family history all reviewed in electronic medical record.  No pertanent information unless stated regarding to the chief complaint.   Review of Systems:  No headache, visual changes, nausea, vomiting, diarrhea, constipation, dizziness, abdominal pain, skin rash, fevers, chills, night sweats, weight loss, swollen lymph nodes,s, chest pain, shortness of breath, mood changes.  Positive muscle aches body aches  Objective  Blood pressure 140/80, pulse (!) 54, height 5\' 8"  (1.727 m), weight 135 lb (61.2 kg), SpO2 99 %.   General: No apparent  distress alert and oriented x3 mood and affect normal, dressed appropriately.  HEENT: Pupils equal, extraocular movements intact  Respiratory: Patient's speak in full sentences and does not appear short of breath  Cardiovascular: No lower extremity edema, non tender, no erythema  Skin: Warm dry intact with no signs of infection or rash on extremities or on axial skeleton.  Abdomen: Soft nontender  Neuro: Cranial nerves II through XII are intact, neurovascularly intact in all extremities with 2+ DTRs and 2+ pulses.  Lymph: No lymphadenopathy of posterior or anterior cervical chain or axillae bilaterally.  Gait normal with good balance and coordination.  MSK:  Non tender with full range of motion and good stability and symmetric strength and tone of shoulders, elbows, wrist, hip, knee and ankles bilaterally.  Back exam shows some mild loss of lordosis.  Negative straight leg test.  Relates no patient doing the straight leg test patient does have some tingling in her toe she states.  Foot exam there is no change.    Impression and Recommendations:     This case required medical decision making of moderate complexity. The above documentation has been reviewed and is accurate and complete Lyndal Pulley, DO       Note: This dictation was prepared with Dragon dictation along with smaller phrase technology. Any transcriptional errors that result from this process are unintentional.

## 2018-11-16 NOTE — Assessment & Plan Note (Signed)
Discussed the icing regimen and home exercises.  Not making as much improvement.  Diagnostically we will try an epidural at the L4-L5 level.  In on this patient could not respond well or no improvement in all and then will need to consider further work-up of patient's feet.  Patient is in agreement with the plan.  Continue medication.  Spent  25 minutes with patient face-to-face and had greater than 50% of counseling including as described above in assessment and plan.

## 2018-11-21 ENCOUNTER — Ambulatory Visit: Payer: PPO | Attending: Family Medicine

## 2018-11-21 ENCOUNTER — Other Ambulatory Visit: Payer: Self-pay

## 2018-11-21 DIAGNOSIS — M6281 Muscle weakness (generalized): Secondary | ICD-10-CM | POA: Diagnosis not present

## 2018-11-21 DIAGNOSIS — M25572 Pain in left ankle and joints of left foot: Secondary | ICD-10-CM | POA: Insufficient documentation

## 2018-11-21 DIAGNOSIS — M25571 Pain in right ankle and joints of right foot: Secondary | ICD-10-CM | POA: Diagnosis not present

## 2018-11-21 NOTE — Patient Instructions (Signed)
Phase 1 of tarnanen exercise protocol include part sit ups , supine and side clams, quadra ped hip ext , prone hip ex, sit to stand.   Goal 30 reps 4-5 days per week

## 2018-11-21 NOTE — Therapy (Signed)
[place], [place], [address] Phone: [phone]   Fax:  [phone]  Physical Therapy Evaluation  Patient Details  Name: [name] MRN: [id number] Date of Birth: 12-12-1951 Referring Provider (PT): Rita Folk DO   Encounter Date: 11/21/2018  PT End of Session - 11/21/18 1736    Visit Number  1    Number of Visits  6    Date for PT Re-Evaluation  02/16/19    Authorization Type  Health team [facility] [facility]    PT Start Time  0430    PT Stop Time  0525    PT Time Calculation (min)  55 min    Activity Tolerance  Patient tolerated treatment well;No increased pain    Behavior During Therapy  WFL for tasks assessed/performed       Past Medical History:  Diagnosis Date  . Anemia   . Anxiety   . Osteoporosis   . Seasonal allergies     Past Surgical History:  Procedure Laterality Date  . 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 BLADDER SUSPENSION    . BUNIONECTOMY Right   . LASIK Bilateral 2001    There were no vitals filed for this visit.   Subjective Assessment - 11/21/18 1638    Subjective  History of foot pain. Medication helps but prolonged standing  and walking incr pain.   Pins and needles in anterior ankles bilaterally  Pain started 04/2018 pain worsened but onset of pain prior to this.    She reports no back or leg pain. .      Limitations  Standing;Walking    How long can you sit comfortably?  as needed    How long can you stand comfortably?  all day during work day.     How long can you walk comfortably?  As needed    Diagnostic tests  MRI and lumbar stenosis noted 3 level    Patient Stated Goals  decr pain    Currently in Pain?  Yes    Pain Score  3     Pain Location  Foot    Pain Orientation  Left   plantar aspect lateral   Pain Descriptors / Indicators  Stabbing   pinching    Pain Type  Chronic pain    Pain Onset  More than a month ago    Pain Frequency  Intermittent    Aggravating Factors   Standing and walking, pressure from shoes.     Pain Relieving Factors  Medication , heat ( only when on) , off feet.          [facility] PT Assessment - 11/21/18 0001      Assessment   Medical Diagnosis  lumbar radiculopathy    Referring Provider (PT)  Rita Folk DO    Onset Date/Surgical Date  --   04/2018   Next MD Visit  4 weeks    Prior Therapy  Yes for LT foot pain. 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         ROM / Strength   AROM / PROM / Strength  AROM;PROM;Strength      AROM   AROM Assessment Site  Lumbar    Lumbar Flexion  84    Lumbar Extension  25    Lumbar - Right Side Bend  20    Lumbar - Left Side Bend  16                Objective measurements completed on examination: See above findings.              PT Education - 11/21/18 2150    Education Details  POC , CORE stab exercises    Person(s) Educated  Patient    Methods  Explanation;Demonstration;Tactile cues;Verbal cues;Handout    Comprehension  Returned demonstration;Verbalized understanding       PT Short Term Goals - 11/21/18 1741      PT SHORT TERM GOAL #1   Title  Independent with level 1 tarnenen program    Time  2    Period  Weeks    Status  New        PT Long Term Goals - 11/21/18 1741      PT LONG TERM GOAL #1   Title  Independent with level 5  Taranen program    Time  8    Period  Weeks    Status  New             Plan - 11/21/18 1737    Clinical Impression Statement  [name] presented to PT with complaints of bilateral foot pain RT< LT worse with activity on feet and better with getting off feet and medications.   No movement or position of back changes symptoms in feet and did not cause any back pain .  She agreed to a stabilization progrma to progress over a few weeks  for core stability.       Clinical Presentation  Stable    Clinical Decision Making  Low    Rehab Potential  Good    PT Frequency  --   1 visit   PT Duration  --   every 2 weeks for 4-6 sessions to progress program if needed.    PT Treatment/Interventions  Patient/family education;Therapeutic exercise    PT Next Visit Plan  [facility] [place] program as tolerated    PT Home Exercise Plan  Level 1 Tarnanen    Consulted and Agree with Plan of Care  Patient       Patient will benefit from skilled therapeutic intervention in order to improve the following deficits and impairments:  Pain, Decreased strength  Visit Diagnosis: Pain in left ankle and joints of left foot - Plan: PT plan of care cert/re-cert  Pain in right ankle and joints of right foot  Muscle weakness (generalized)     Problem List Patient Active Problem List   Diagnosis Date Noted  . Spinal stenosis of lumbar region 11/16/2018  . Bilateral foot pain 09/26/2018  . Lateral epicondylitis of left elbow 05/26/2017  . ALLERGIC RHINITIS, SEASONAL 01/06/2011  . HIATAL HERNIA 12/31/2010  .  BLOOD IN STOOL 12/31/2010  . ALLERGY 12/31/2010    Darrel Hoover  PT 11/21/2018, 9:57 PM  [facility] [address] [place], [place], [address] Phone: [phone]   Fax:  [phone]  Name: [name] MRN: [id number] Date of Birth: [birth year]

## 2018-11-29 ENCOUNTER — Encounter: Payer: Self-pay | Admitting: Family Medicine

## 2018-11-30 ENCOUNTER — Other Ambulatory Visit: Payer: PPO

## 2018-12-05 ENCOUNTER — Ambulatory Visit: Payer: PPO | Attending: Family Medicine

## 2018-12-05 DIAGNOSIS — M6281 Muscle weakness (generalized): Secondary | ICD-10-CM | POA: Insufficient documentation

## 2018-12-05 DIAGNOSIS — M25571 Pain in right ankle and joints of right foot: Secondary | ICD-10-CM

## 2018-12-05 DIAGNOSIS — M25572 Pain in left ankle and joints of left foot: Secondary | ICD-10-CM | POA: Diagnosis not present

## 2018-12-05 NOTE — Therapy (Addendum)
[place], [place], [address] Phone: [phone]   Fax:  [phone]  Physical Therapy Treatment/discharge  Patient Details  Name: [name] MRN: [id number] Date of Birth: 12-02-1951 Referring Provider (PT): Barrie Folk DO   Encounter Date: 12/05/2018  PT End of Session - 12/05/18 1536    Visit Number  2    Number of Visits  6    Date for PT Re-Evaluation  02/16/19    Authorization Type  Health team [facility]    PT Start Time  0336    PT Stop Time  4403    PT Time Calculation (min)  39 min    Activity Tolerance  Patient tolerated treatment well;No increased pain    Behavior During Therapy  WFL for tasks assessed/performed       Past Medical History:  Diagnosis Date  . Anemia   . Anxiety   . Osteoporosis   . Seasonal allergies     Past Surgical History:  Procedure Laterality Date  . 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 BLADDER SUSPENSION    . BUNIONECTOMY Right   . LASIK Bilateral 2001    There were no vitals filed for this visit.  Subjective Assessment - 12/05/18 1537    Subjective  No complaints with HEP.     Pain Score  3     Pain Location  Foot    Pain Orientation  Right;Left    Pain Descriptors / Indicators  Stabbing    Pain Onset  More than a month ago    Pain Frequency  Intermittent    Aggravating Factors   standing, walk    Pain Relieving Factors  meds , getting off feet                       [facility] Adult PT Treatment/Exercise - 12/05/18 0001      Exercises   Exercises  Lumbar      Reviewed whole Tarnanen program for HEP       PT Education - 12/05/18 1626    Education Details  Advanced to Level 5 of Tareneen exer program and issued bands . She was able to do all without pain 5019 reps   We discy=ussed that she should progress with band and reps as tolerated and to continue  bird dog and othe exercise for  core strength. Also advised to do prone extension rather than reformer Pilates for strength  but with no gripping due to elbow pain.     Person(s) Educated  Patient    Methods  Explanation;Demonstration;Tactile cues;Verbal cues;Handout    Comprehension  Verbalized understanding;Returned demonstration       PT Short Term Goals - 12/05/18 1630      PT SHORT TERM GOAL #1   Title  Independent with level 1 tarnenen program    Status  Achieved        PT Long Term Goals - 12/05/18 1630      PT LONG TERM GOAL #1   Title  Independent with level 5 Taranen program    Status  Achieved            Plan - 12/05/18 1536    Clinical Impression Statement  She felt she could manage HEP indpendent ly and she was isued bands and review of whole Tarneneen program .   Cautioned to stop any that caused pain in back or  feet,     She should be able to progress appropriately    PT Treatment/Interventions  Patient/family education;Therapeutic exercise    PT Next Visit Plan  Advance [place] program at home so discarge today.     PT Home Exercise Plan  Level 1- 5 Tarnanen    Consulted and Agree with Plan of Care  Patient       Patient will benefit from skilled therapeutic intervention in order to improve the following deficits and impairments:  Pain, Decreased strength  Visit Diagnosis: Pain in left ankle and joints of left foot  Pain in right ankle and joints of right foot  Muscle weakness (generalized)     Problem List Patient Active Problem List   Diagnosis Date Noted  . Spinal stenosis of lumbar region 11/16/2018  . Bilateral foot pain 09/26/2018  . Lateral epicondylitis of left elbow 05/26/2017  . ALLERGIC RHINITIS, SEASONAL 01/06/2011  . HIATAL HERNIA 12/31/2010  . BLOOD IN STOOL 12/31/2010  . ALLERGY 12/31/2010    Darrel Hoover  PT 12/05/2018, 4:31 PM  [facility] [address] [place], [place], [address] Phone:  [phone]   Fax:  [phone]  Name: [name] MRN: [id number] Date of Birth: 1951-12-18  PHYSICAL THERAPY DISCHARGE SUMMARY  Visits from Start of Care: 2  Current functional level related to goals / functional outcomes: She self discharged with HEP   Remaining deficits: Unknown   Education / Equipment: HEP Plan: Patient agrees to discharge.  Patient goals were met. Patient is being discharged due to the patient's request.  ?????    Pearson Forster PT  06/04/19

## 2018-12-06 ENCOUNTER — Encounter: Payer: Self-pay | Admitting: Family Medicine

## 2018-12-13 ENCOUNTER — Other Ambulatory Visit: Payer: Self-pay | Admitting: *Deleted

## 2018-12-13 MED ORDER — GABAPENTIN 100 MG PO CAPS: ORAL_CAPSULE | ORAL | 1 refills | Status: DC

## 2019-02-19 ENCOUNTER — Encounter: Payer: Self-pay | Admitting: *Deleted

## 2019-02-20 ENCOUNTER — Ambulatory Visit: Payer: PPO | Admitting: Diagnostic Neuroimaging

## 2019-02-20 ENCOUNTER — Encounter: Payer: Self-pay | Admitting: Diagnostic Neuroimaging

## 2019-02-20 VITALS — BP 134/82 | HR 65 | Ht 68.0 in | Wt 136.2 lb

## 2019-02-20 DIAGNOSIS — G629 Polyneuropathy, unspecified: Secondary | ICD-10-CM | POA: Diagnosis not present

## 2019-02-20 NOTE — Progress Notes (Signed)
[facility]  PATIENT: [name] DOB: Jan 16, 1951  REFERRING CLINICIAN: Cipriano Mile HISTORY FROM: patient  REASON FOR VISIT: new consult    HISTORICAL  CHIEF COMPLAINT:  Chief Complaint  Patient presents with  . Pain    rm 7, New Pt, "foot pain since April 2019"    HISTORY OF PRESENT ILLNESS:   68 year old female here for evaluation of foot pain.  April 2019 patient noticed onset of bilateral foot pain, numbness and tingling.  This affected left more than right side.  Around that time she had started to use a small trampoline for exercise.  Patient initially went to podiatry for evaluation.  She then went to sports medicine and PCP as well.  By July 2019 symptoms had progressed up to her ankle and calf regions.  At some point she noticed some intermittent tingling in her fingers.    Patient tried a variety of interventions including changing her medications and supplements, eliminating gluten from diet, and started low-dose gabapentin 100 in the morning and 200 at night.  Since January 2020 patient is noticed symptoms have gradually improved.    REVIEW OF SYSTEMS: Full 14 system review of systems performed and negative with exception of: Head tremor.  ALLERGIES: Allergies  Allergen Reactions  . Boniva [Ibandronic Acid] Nausea Only  . Codeine     REACTION: vomiting    HOME MEDICATIONS: Outpatient Medications Prior to Visit  Medication Sig Dispense Refill  . cetirizine (ZYRTEC) 10 MG tablet Take 10 mg by mouth as needed for allergies. Reported on 01/23/2016    . Cholecalciferol (VITAMIN D3 PO) Take 5,000 Units by mouth daily.    . clonazePAM (KLONOPIN) 0.5 MG tablet Take 0.5 mg by mouth 2 (two) times daily as needed for anxiety.    . gabapentin (NEURONTIN) 100 MG capsule Take 1 capsule in the AM & 2 capsules at bedtime. 270 capsule 1  . IRON CR PO Take 65 mg by mouth. 2-3 x weekly    . MAGNESIUM CARBONATE PO Take 350 mg by mouth daily.    . Multiple  Vitamins-Minerals (OSTEOPRIME ULTRA PO) Take by mouth.    . OMEGA-3 FATTY ACIDS PO Take by mouth. 1280 mg daily    . vitamin C (ASCORBIC ACID) 500 MG tablet Take 500 mg by mouth daily.    . Ibuprofen-Famotidine 800-26.6 MG TABS Take 1 tablet by mouth 3 (three) times daily as needed. (Patient not taking: Reported on 02/20/2019) 90 tablet 3  . meloxicam (MOBIC) 15 MG tablet Take 1 tablet (15 mg total) by mouth daily. (Patient not taking: Reported on 11/21/2018) 30 tablet 0  . nitroGLYCERIN (NITRODUR - DOSED IN MG/24 HR) 0.2 mg/hr patch 1/4 patch daily (Patient not taking: Reported on 11/21/2018) 30 patch 1  . Vitamin D, Ergocalciferol, (DRISDOL) 50000 units CAPS capsule take 1 capsule by mouth every 7 days (Patient not taking: Reported on 11/21/2018) 12 capsule 0   No facility-administered medications prior to visit.     PAST MEDICAL HISTORY: Past Medical History:  Diagnosis Date  . Anemia   . Anxiety   . Osteoporosis   . Seasonal allergies   . Vitamin D deficiency     PAST SURGICAL HISTORY: Past Surgical History:  Procedure Laterality Date  . 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 APPENDECTOMY    . BLADDER SUSPENSION    . BUNIONECTOMY Right   . LASIK Bilateral 2001  . TONSILLECTOMY  FAMILY HISTORY: Family History  Problem Relation Age of Onset  . Heart failure Mother   . Kidney failure Mother   . Thyroid cancer Mother   . Hypertension Mother   . Heart failure Father   . Melanoma Father   . Hypertension Father   . Tremor Father   . Osteoporosis Sister   . Stroke Sister   . Melanoma Sister   . Other Sister        fibromuscular dysphasia of vertebral L carotid arteries  . Heart attack Maternal Grandmother   . Heart failure Maternal Grandmother   . Melanoma Sister   . Thyroid cancer Sister   . Osteoporosis Sister   . Colon cancer Neg Hx     SOCIAL HISTORY: Social History   Socioeconomic History  . Marital status:  Married    Spouse name: [name]  . Number of children: 2  . Years of education: Not on file  . Highest education level: Master's degree (e.g., MA, MS, MEng, MEd, MSW, MBA)  Occupational History    Comment: school teacher  Social Needs  . Financial resource strain: Not on file  . Food insecurity:    Worry: Not on file    Inability: Not on file  . Transportation needs:    Medical: Not on file    Non-medical: Not on file  Tobacco Use  . Smoking status: Never Smoker  . Smokeless tobacco: Never Used  Substance and Sexual Activity  . Alcohol use: Yes    Alcohol/week: 7.0 standard drinks    Types: 7 Glasses of wine per week    Comment: 1 glass wine daily  . Drug use: No  . Sexual activity: Not on file  Lifestyle  . Physical activity:    Days per week: Not on file    Minutes per session: Not on file  . Stress: Not on file  Relationships  . Social connections:    Talks on phone: Not on file    Gets together: Not on file    Attends religious service: Not on file    Active member of club or organization: Not on file    Attends meetings of clubs or organizations: Not on file    Relationship status: Not on file  . Intimate partner violence:    Fear of current or ex partner: Not on file    Emotionally abused: Not on file    Physically abused: Not on file    Forced sexual activity: Not on file  Other Topics Concern  . Not on file  Social History Narrative   Lives with husband   Caffeine- coffee 2 daily     PHYSICAL EXAM  GENERAL EXAM/CONSTITUTIONAL: Vitals:  Vitals:   02/20/19 1243  BP: 134/82  Pulse: 65  Weight: 136 lb 3.2 oz (61.8 kg)  Height: 5\' 8"  (1.727 m)     Body mass index is 20.71 kg/m. Wt Readings from Last 3 Encounters:  02/20/19 136 lb 3.2 oz (61.8 kg)  11/16/18 135 lb (61.2 kg)  10/19/18 136 lb (61.7 kg)     Patient is in no distress; well developed, nourished and groomed; neck is supple  CARDIOVASCULAR:  Examination of carotid arteries is  normal; no carotid bruits  Regular rate and rhythm, no murmurs  Examination of peripheral vascular system by observation and palpation is normal  EYES:  Ophthalmoscopic exam of optic discs and posterior segments is normal; no papilledema or hemorrhages  Visual Acuity Screening   Right eye Left  eye Both eyes  Without correction: 20/20 20/30   With correction:        MUSCULOSKELETAL:  Gait, strength, tone, movements noted in Neurologic exam below  NEUROLOGIC: MENTAL STATUS:  No flowsheet data found.  awake, alert, oriented to person, place and time  recent and remote memory intact  normal attention and concentration  language fluent, comprehension intact, naming intact  fund of knowledge appropriate  CRANIAL NERVE:   2nd - no papilledema on fundoscopic exam  2nd, 3rd, 4th, 6th - pupils equal and reactive to light, visual fields full to confrontation, extraocular muscles intact, no nystagmus  5th - facial sensation symmetric  7th - facial strength symmetric  8th - hearing intact  9th - palate elevates symmetrically, uvula midline  11th - shoulder shrug symmetric  12th - tongue protrusion midline  MOTOR:   normal bulk and tone, full strength in the BUE, BLE  SENSORY:   normal and symmetric to light touch, pinprick, temperature, vibration; EXCEPT DECR PP IN LEFT FOOT  COORDINATION:   finger-nose-finger, fine finger movements normal  REFLEXES:   deep tendon reflexes 1+ and symmetric; TRACE AT ANKLES  DOWN GOING TOES  GAIT/STATION:   narrow based gait; romberg is negative     DIAGNOSTIC DATA (LABS, IMAGING, TESTING) - I reviewed patient records, labs, notes, testing and imaging myself where available.  Lab Results  Component Value Date   WBC 6.6 09/26/2018   HGB 12.8 09/26/2018   HCT 37.8 09/26/2018   MCV 92.6 09/26/2018   PLT 216.0 09/26/2018      Component Value Date/Time   NA 138 09/26/2018 1608   K 4.1 09/26/2018 1608   CL 102  09/26/2018 1608   CO2 30 09/26/2018 1608   GLUCOSE 82 09/26/2018 1608   BUN 21 09/26/2018 1608   CREATININE 0.83 09/26/2018 1608   CALCIUM 8.7 09/26/2018 1608   CALCIUM 9.4 09/26/2018 1608   PROT 7.2 09/26/2018 1608   ALBUMIN 4.6 09/26/2018 1608   AST 28 09/26/2018 1608   ALT 17 09/26/2018 1608   ALKPHOS 52 09/26/2018 1608   BILITOT 0.4 09/26/2018 1608   No results found for: CHOL, HDL, LDLCALC, LDLDIRECT, TRIG, CHOLHDL No results found for: HGBA1C Lab Results  Component Value Date   WNIOEVOJ50 093 09/26/2018   Lab Results  Component Value Date   TSH 1.88 09/26/2018    10/27/18 MRI lumbar spine [I reviewed images myself and agree with interpretation. -VRP]  1. No fracture or acute osseous process. Minimal grade 1 L2-3 retrolisthesis without spondylolysis. 2. LEFT extraforaminal L3-4 disc protrusion may affect the exited LEFT L3 nerve. 3. No canal stenosis. Minimal to mild L3-4 and L4-5 neural foraminal narrowing.    ASSESSMENT AND PLAN  68 y.o. year old female here with new onset of numbness, prickly sensation, stabbing and cold sensation in the feet and ankles, slightly in the fingers, since April 2019.  Symptoms now improving since January 2020.  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  Symptoms are gradually improving and therefore will monitor for now.  May consider additional neuropathy work-up if symptoms worsen.   Ddx: neuropathy (post-viral, idiopathic, hereditary, metabolic, autoimmune)  1. Neuropathy      PLAN:  - monitor sxs (now gradually improving) - consider neuropathy labs and EMG in future if sxs worsen - continue gabapentin  Return for pending if symptoms worsen or fail to improve.    Penni Bombard, MD 08/13/2992, 7:16 PM Certified in Neurology, Neurophysiology and Neuroimaging  [facility] [address] [place],  [address] [phone]

## 2019-02-20 NOTE — Patient Instructions (Signed)
-   monitor sxs (now gradually improving)  - consider neuropathy labs and EMG in future if sxs worsen  - continue gabapentin

## 2019-04-16 DIAGNOSIS — M810 Age-related osteoporosis without current pathological fracture: Secondary | ICD-10-CM | POA: Diagnosis not present

## 2019-04-16 DIAGNOSIS — Z1389 Encounter for screening for other disorder: Secondary | ICD-10-CM | POA: Diagnosis not present

## 2019-04-16 DIAGNOSIS — F419 Anxiety disorder, unspecified: Secondary | ICD-10-CM | POA: Diagnosis not present

## 2019-04-16 DIAGNOSIS — Z0000 Encounter for general adult medical examination without abnormal findings: Secondary | ICD-10-CM | POA: Diagnosis not present

## 2019-06-05 ENCOUNTER — Ambulatory Visit (INDEPENDENT_AMBULATORY_CARE_PROVIDER_SITE_OTHER): Payer: PPO | Admitting: Family Medicine

## 2019-06-05 ENCOUNTER — Encounter: Payer: Self-pay | Admitting: Family Medicine

## 2019-06-05 DIAGNOSIS — M79671 Pain in right foot: Secondary | ICD-10-CM

## 2019-06-05 DIAGNOSIS — M79672 Pain in left foot: Secondary | ICD-10-CM | POA: Diagnosis not present

## 2019-06-05 DIAGNOSIS — M48062 Spinal stenosis, lumbar region with neurogenic claudication: Secondary | ICD-10-CM | POA: Diagnosis not present

## 2019-06-05 MED ORDER — GABAPENTIN 100 MG PO CAPS: ORAL_CAPSULE | ORAL | 1 refills | Status: DC

## 2019-06-05 NOTE — Progress Notes (Signed)
Virtual Visit via Video Note  I connected with [name] on 06/05/19 at 12:45 PM EDT by a video enabled telemedicine application and verified that I am speaking with the correct person using two identifiers.  Location: Patient: home setting Provider: office setting    I discussed the limitations of evaluation and management by telemedicine and the availability of in person appointments. The patient expressed understanding and agreed to proceed.  History of Present Illness: Patient is following up for more of her radicular symptoms and foot pain.  Has been seen by other providers since.  Has been making some progress.  Saw a neurologist.  He feels that it is more secondary to her feet and her back.  States that there is nothing that they can do.  Patient has noticed that since she is changed her diet has been doing somewhat better.  Has gone gluten-free.  Still taking the gabapentin but does not know if she needs to continue to do so.    Observations/Objective: Patient is very pleasant.  Very happy when talking about her retirement   Assessment and Plan: Peripheral neuropathy.  Discussed with patient in great length.  Discussed home exercise Regimen.  Patient is to increase activity slowly.  Up to her she was to continue with the gabapentin.   Follow Up Instructions: 6 months    I discussed the assessment and treatment plan with the patient. The patient was provided an opportunity to ask questions and all were answered. The patient agreed with the plan and demonstrated an understanding of the instructions.   The patient was advised to call back or seek an in-person evaluation if the symptoms worsen or if the condition fails to improve as anticipated.  I provided 16 minutes of face-to-face time during this encounter.   Lyndal Pulley, DO

## 2019-07-02 DIAGNOSIS — Z1231 Encounter for screening mammogram for malignant neoplasm of breast: Secondary | ICD-10-CM | POA: Diagnosis not present

## 2019-09-12 ENCOUNTER — Encounter: Payer: Self-pay | Admitting: Podiatry

## 2019-09-12 ENCOUNTER — Other Ambulatory Visit: Payer: Self-pay | Admitting: Podiatry

## 2019-09-12 ENCOUNTER — Ambulatory Visit (INDEPENDENT_AMBULATORY_CARE_PROVIDER_SITE_OTHER): Payer: PPO

## 2019-09-12 ENCOUNTER — Other Ambulatory Visit: Payer: Self-pay

## 2019-09-12 ENCOUNTER — Ambulatory Visit: Payer: PPO | Admitting: Podiatry

## 2019-09-12 DIAGNOSIS — M79671 Pain in right foot: Secondary | ICD-10-CM

## 2019-09-12 DIAGNOSIS — G629 Polyneuropathy, unspecified: Secondary | ICD-10-CM

## 2019-09-12 DIAGNOSIS — M21629 Bunionette of unspecified foot: Secondary | ICD-10-CM

## 2019-09-12 DIAGNOSIS — M79672 Pain in left foot: Secondary | ICD-10-CM

## 2019-09-12 DIAGNOSIS — M779 Enthesopathy, unspecified: Secondary | ICD-10-CM

## 2019-09-12 DIAGNOSIS — M7752 Other enthesopathy of left foot: Secondary | ICD-10-CM | POA: Diagnosis not present

## 2019-09-12 DIAGNOSIS — L84 Corns and callosities: Secondary | ICD-10-CM

## 2019-09-12 DIAGNOSIS — M7751 Other enthesopathy of right foot: Secondary | ICD-10-CM

## 2019-09-18 NOTE — Progress Notes (Signed)
Subjective:   Patient ID: [name], female   DOB: 68 y.o.   MRN: [id number]   HPI Patient presents with a lot of pain in the outside the left foot and also states that she is been getting nerve pain and is concerned about bunion deformity and has a lesion that is painful   ROS      Objective:  Physical Exam  Neurovascular status intact with prominence of the left fifth metatarsal with fluid buildup around the joint surface and keratotic lesion formation that is moderately painful when palpated and     Assessment:  Laboratory capsulitis of the fifth MPJ left with keratotic lesion tailor's bunion deformity and also neuropathy-like symptoms     Plan:  H&P reviewed all conditions and did a careful injection outside left fifth MPJ after sterile prep 3 mg Dexasone 5 mg Xylocaine debrided plantar lesion with no iatrogenic bleeding discussed neuropathy and will start her on gabapentin with follow-up to be done by family physician  X-ray indicates there is structural malalignment with no indications of advanced arthritis stress fracture

## 2019-11-15 DIAGNOSIS — L821 Other seborrheic keratosis: Secondary | ICD-10-CM | POA: Diagnosis not present

## 2019-11-15 DIAGNOSIS — D2261 Melanocytic nevi of right upper limb, including shoulder: Secondary | ICD-10-CM | POA: Diagnosis not present

## 2019-11-15 DIAGNOSIS — D225 Melanocytic nevi of trunk: Secondary | ICD-10-CM | POA: Diagnosis not present

## 2019-11-15 DIAGNOSIS — D2372 Other benign neoplasm of skin of left lower limb, including hip: Secondary | ICD-10-CM | POA: Diagnosis not present

## 2019-11-15 DIAGNOSIS — D2262 Melanocytic nevi of left upper limb, including shoulder: Secondary | ICD-10-CM | POA: Diagnosis not present

## 2019-11-15 DIAGNOSIS — D2371 Other benign neoplasm of skin of right lower limb, including hip: Secondary | ICD-10-CM | POA: Diagnosis not present

## 2019-11-15 DIAGNOSIS — L718 Other rosacea: Secondary | ICD-10-CM | POA: Diagnosis not present

## 2019-11-17 IMAGING — DX DG LUMBAR SPINE COMPLETE 4+V
5 series · 5 of 5 positions shown · non-contrast
Comparison: None.

CLINICAL DATA: 67-year-old female with a history of lower back pain
and left hip pain for 2 months

EXAM:
LUMBAR SPINE - COMPLETE 4+ VIEW

[l-spine ap]
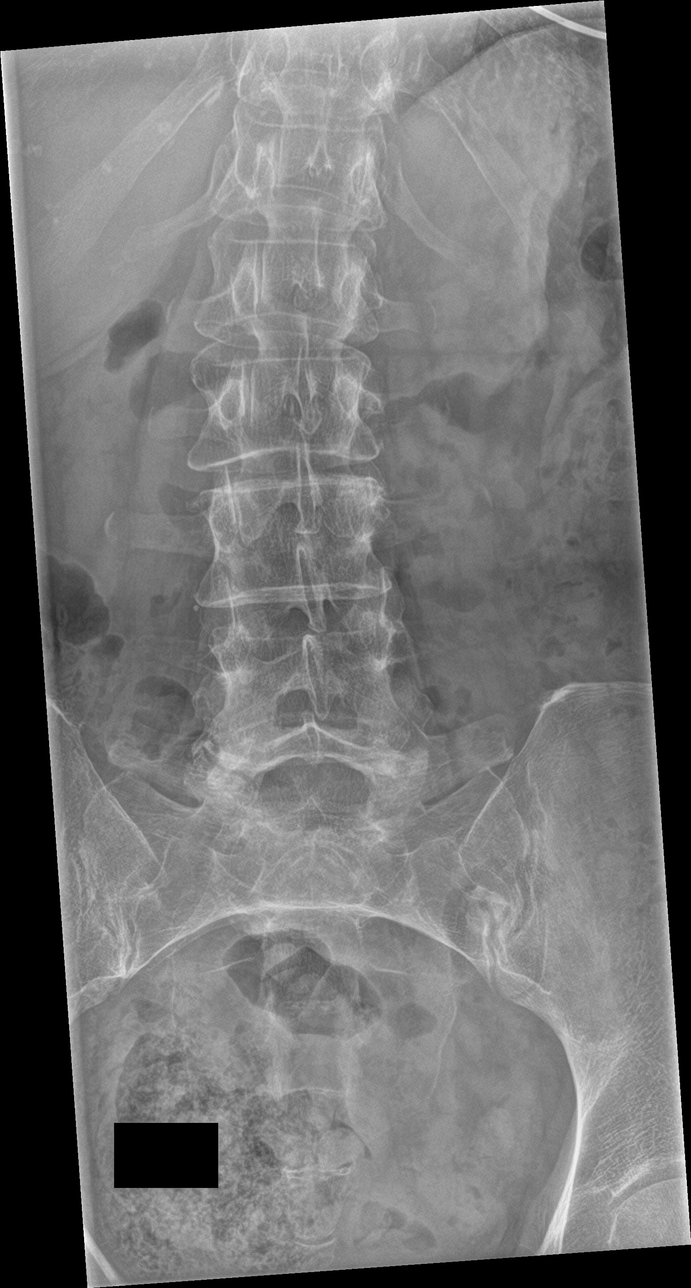

[l-spine obl (1 of 2)]
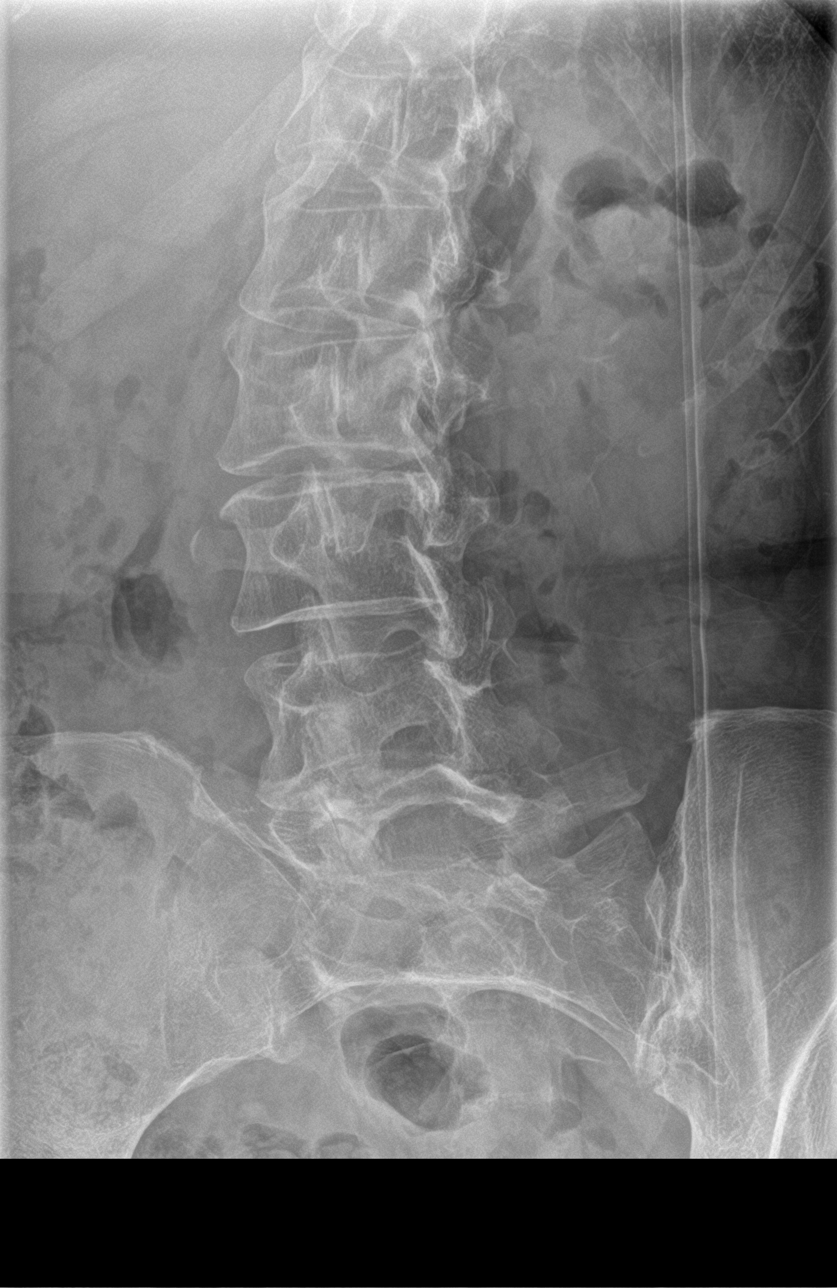

[l-spine obl (2 of 2)]
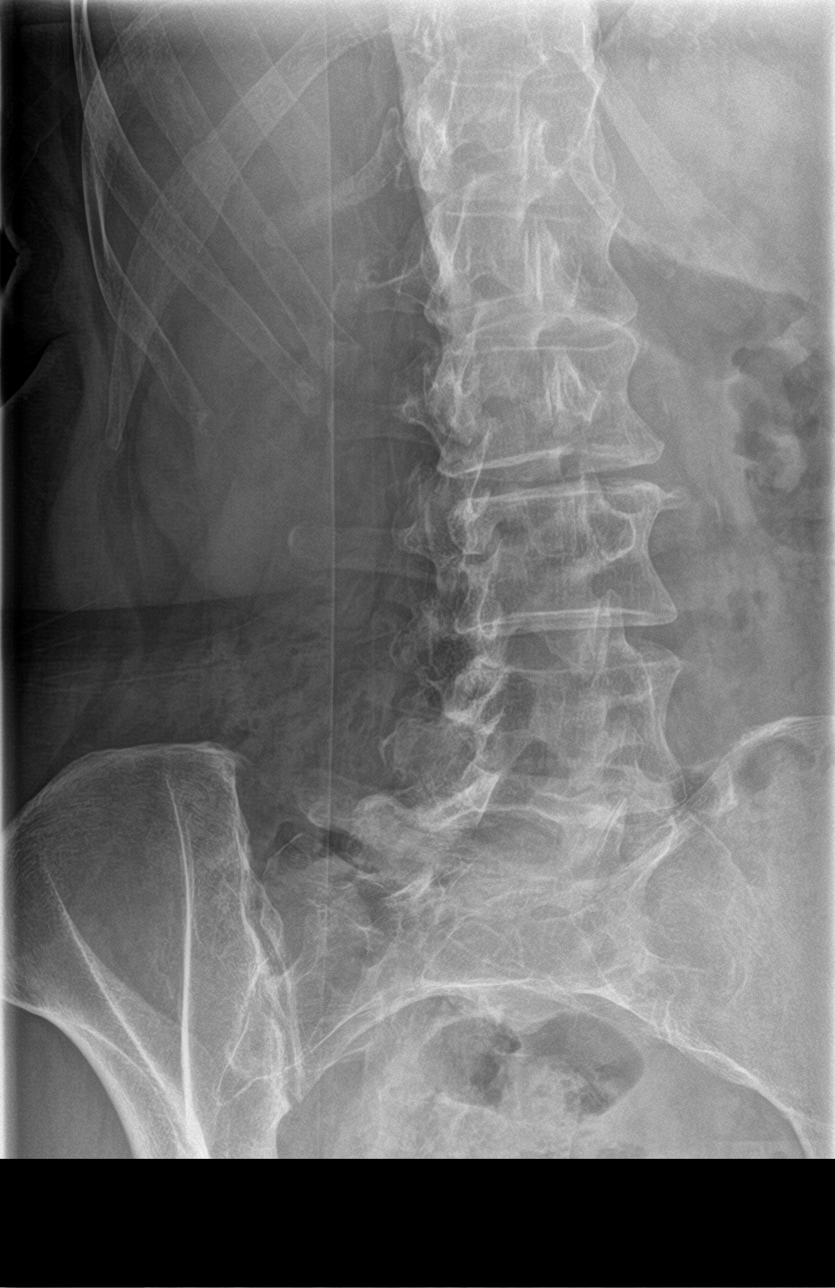

[l-spine lat]
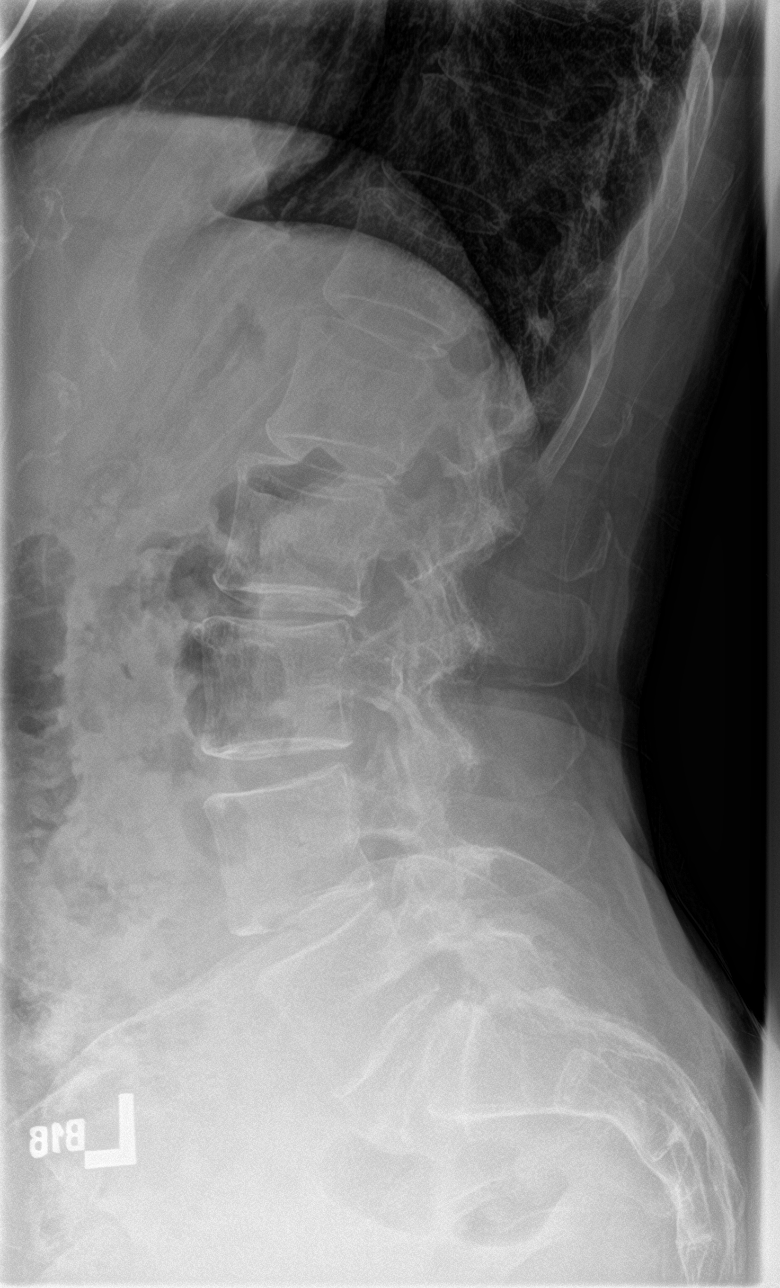

[l-spine spot]
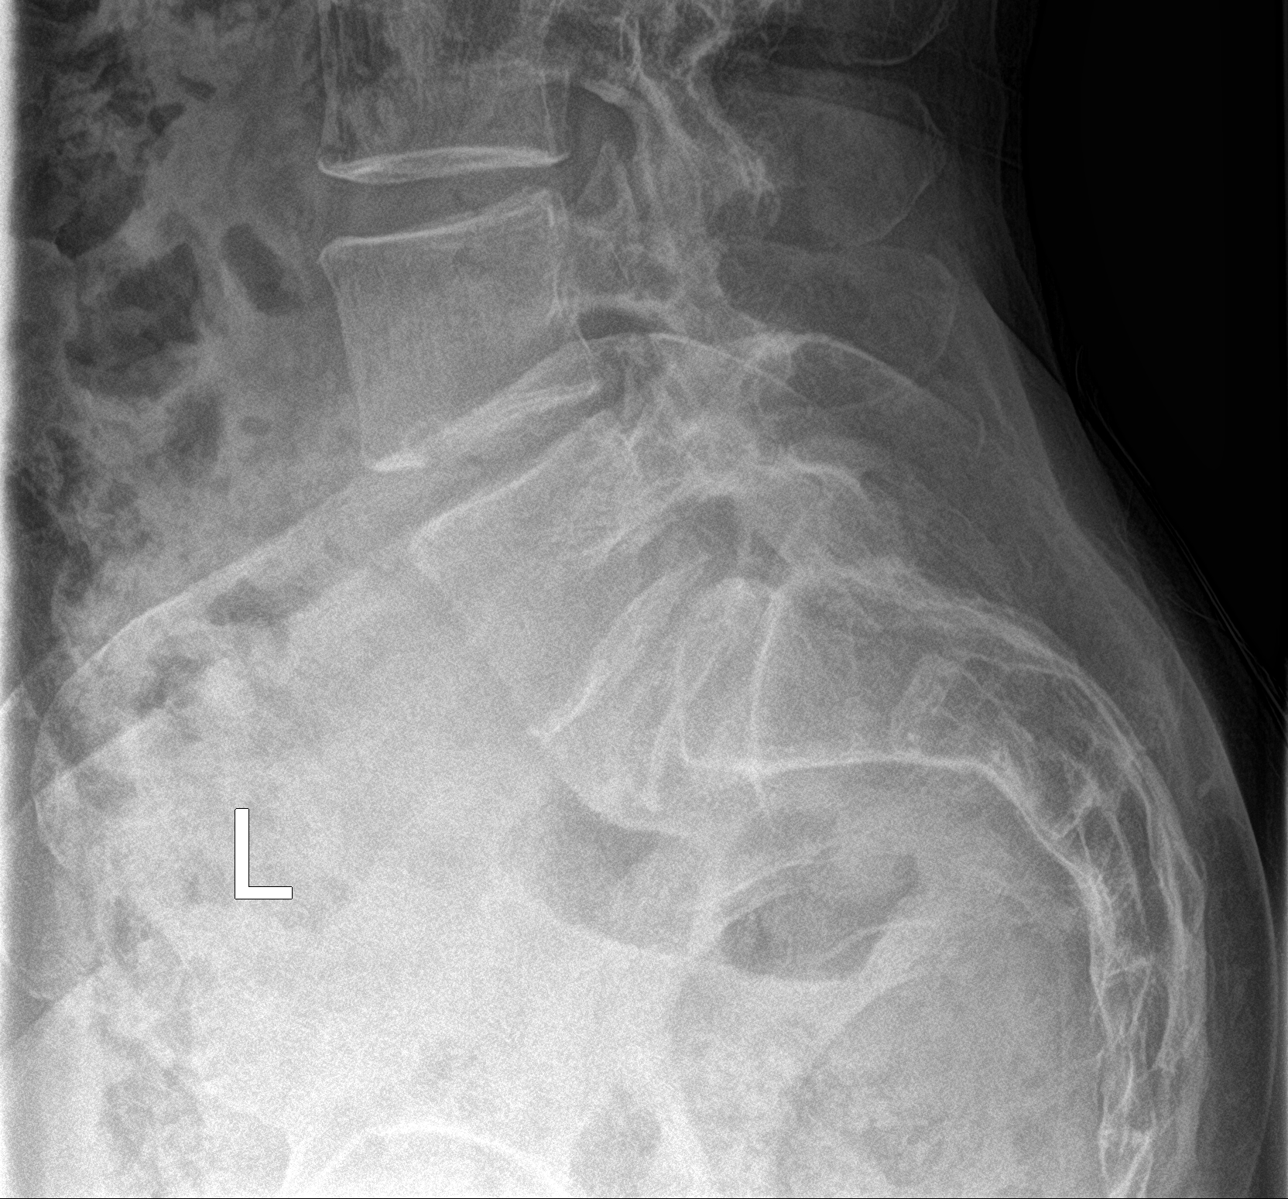

[5 of 5 positions shown; findings below may reference images not displayed]

FINDINGS: Lumbar Spine:

Lumbar vertebral elements maintain normal alignment without evidence
of subluxation.

Trace retrolisthesis of L2 on L3 and L3 on L4.

Vertebral body heights maintained.  No acute fracture line.

Disc space narrowing throughout the lumbar spine most pronounced at
L5-S1 with endplate changes.

Facet hypertrophy is most severe at the L4-L5 and L5-S1 levels.

Oblique images demonstrate no displaced pars defect.

Unremarkable appearance of the visualized abdomen.
IMPRESSION: Negative for acute fracture or malalignment of the lumbar spine.

Developing degenerative disc disease throughout the lumbar spine
which is most severe at L5-S1, with facet disease most pronounced at
L4-S1.

## 2019-12-13 ENCOUNTER — Other Ambulatory Visit: Payer: Self-pay | Admitting: *Deleted

## 2019-12-13 MED ORDER — GABAPENTIN 100 MG PO CAPS: ORAL_CAPSULE | ORAL | 0 refills | Status: DC

## 2020-01-16 ENCOUNTER — Ambulatory Visit: Payer: PPO | Attending: Internal Medicine

## 2020-01-16 DIAGNOSIS — Z23 Encounter for immunization: Secondary | ICD-10-CM | POA: Insufficient documentation

## 2020-01-16 NOTE — Progress Notes (Signed)
   Covid-19 Vaccination Clinic  Name:  Sheenia Redburn    MRN: [id number] DOB: [birth year]  01/16/2020  Ms. Kratzer was observed post Covid-19 immunization for 15 minutes without incidence. She was provided with Vaccine Information Sheet and instruction to access the V-Safe system.   Ms. Harstad was instructed to call 911 with any severe reactions post vaccine: Marland Kitchen Difficulty breathing  . Swelling of your face and throat  . A fast heartbeat  . A bad rash all over your body  . Dizziness and weakness    Immunizations Administered    Name Date Dose VIS Date Route   Pfizer COVID-19 Vaccine 01/16/2020  3:23 PM 0.3 mL 12/07/2019 Intramuscular   Manufacturer: [place]   Lot: [id number]   [place]: [id number]

## 2020-02-04 ENCOUNTER — Ambulatory Visit: Payer: PPO

## 2020-02-06 ENCOUNTER — Ambulatory Visit: Payer: PPO | Attending: Internal Medicine

## 2020-02-06 DIAGNOSIS — Z23 Encounter for immunization: Secondary | ICD-10-CM | POA: Insufficient documentation

## 2020-02-06 NOTE — Progress Notes (Signed)
   Covid-19 Vaccination Clinic  Name:  Kaysha Hilker    MRN: [id number] DOB: 1951-06-27  02/06/2020  Ms. Wenger was observed post Covid-19 immunization for 15 minutes without incidence. She was provided with Vaccine Information Sheet and instruction to access the V-Safe system.   Ms. Vanderwerff was instructed to call 911 with any severe reactions post vaccine: Marland Kitchen Difficulty breathing  . Swelling of your face and throat  . A fast heartbeat  . A bad rash all over your body  . Dizziness and weakness    Immunizations Administered    Name Date Dose VIS Date Route   Pfizer COVID-19 Vaccine 02/06/2020  4:22 PM 0.3 mL 12/07/2019 Intramuscular   Manufacturer: [place]   Lot: [id number]   [place]: [id number]

## 2020-03-17 ENCOUNTER — Encounter: Payer: Self-pay | Admitting: Family Medicine

## 2020-03-17 ENCOUNTER — Other Ambulatory Visit: Payer: Self-pay

## 2020-03-17 ENCOUNTER — Other Ambulatory Visit: Payer: Self-pay | Admitting: Family Medicine

## 2020-03-17 MED ORDER — GABAPENTIN 100 MG PO CAPS: ORAL_CAPSULE | ORAL | 0 refills | Status: DC

## 2020-03-24 DIAGNOSIS — M810 Age-related osteoporosis without current pathological fracture: Secondary | ICD-10-CM | POA: Diagnosis not present

## 2020-03-24 DIAGNOSIS — M85851 Other specified disorders of bone density and structure, right thigh: Secondary | ICD-10-CM | POA: Diagnosis not present

## 2020-03-24 DIAGNOSIS — Z780 Asymptomatic menopausal state: Secondary | ICD-10-CM | POA: Diagnosis not present

## 2020-03-24 DIAGNOSIS — M85852 Other specified disorders of bone density and structure, left thigh: Secondary | ICD-10-CM | POA: Diagnosis not present

## 2020-05-07 DIAGNOSIS — Z1389 Encounter for screening for other disorder: Secondary | ICD-10-CM | POA: Diagnosis not present

## 2020-05-07 DIAGNOSIS — Z0000 Encounter for general adult medical examination without abnormal findings: Secondary | ICD-10-CM | POA: Diagnosis not present

## 2020-05-07 DIAGNOSIS — F419 Anxiety disorder, unspecified: Secondary | ICD-10-CM | POA: Diagnosis not present

## 2020-05-07 DIAGNOSIS — Z136 Encounter for screening for cardiovascular disorders: Secondary | ICD-10-CM | POA: Diagnosis not present

## 2020-05-07 DIAGNOSIS — E559 Vitamin D deficiency, unspecified: Secondary | ICD-10-CM | POA: Diagnosis not present

## 2020-05-07 DIAGNOSIS — R634 Abnormal weight loss: Secondary | ICD-10-CM | POA: Diagnosis not present

## 2020-05-07 DIAGNOSIS — M810 Age-related osteoporosis without current pathological fracture: Secondary | ICD-10-CM | POA: Diagnosis not present

## 2020-05-29 ENCOUNTER — Telehealth: Payer: Self-pay | Admitting: Diagnostic Neuroimaging

## 2020-05-29 NOTE — Telephone Encounter (Signed)
Called patient who stated she suspected gabapentin has caused hair loss. She's titrated down and is now taking one nightly and one every other morning. She said if she does well with neuropathy she'll drop morning dose next week. She has follow up 06/11/20. She will discuss alternative medications then. Patient verbalized understanding, appreciation.

## 2020-05-29 NOTE — Telephone Encounter (Signed)
Pt called to schedule apt with MD states the gabapentin for her neuropathy is causing her to lose her eyebrow and eyelash hairs and would like to know if there is another medication she could take

## 2020-06-03 DIAGNOSIS — L309 Dermatitis, unspecified: Secondary | ICD-10-CM | POA: Diagnosis not present

## 2020-06-11 ENCOUNTER — Other Ambulatory Visit: Payer: Self-pay

## 2020-06-11 ENCOUNTER — Encounter: Payer: Self-pay | Admitting: Diagnostic Neuroimaging

## 2020-06-11 ENCOUNTER — Ambulatory Visit: Payer: PPO | Admitting: Diagnostic Neuroimaging

## 2020-06-11 VITALS — BP 127/76 | HR 59 | Ht 68.0 in | Wt 130.8 lb

## 2020-06-11 DIAGNOSIS — M792 Neuralgia and neuritis, unspecified: Secondary | ICD-10-CM | POA: Diagnosis not present

## 2020-06-11 DIAGNOSIS — L03115 Cellulitis of right lower limb: Secondary | ICD-10-CM | POA: Diagnosis not present

## 2020-06-11 NOTE — Progress Notes (Signed)
[facility]  PATIENT: [name] DOB: 1951/11/16  REFERRING CLINICIAN: Cipriano Mile HISTORY FROM: patient  REASON FOR VISIT: follow up   HISTORICAL  CHIEF COMPLAINT:  Chief Complaint  Patient presents with  . Neuropathy    rm 7, 1 year FU "doing well but losing hair on gabapentin, have weaned myself down to one tab every other day"    HISTORY OF PRESENT ILLNESS:   UPDATE (06/11/20, VRP): Since last visit, doing well. Symptoms are stable. Severity is mild. No alleviating or aggravating factors. Tolerating gabapentin, but noted some hair loss issues, so has been tapering off over last few weeks.    PRIOR HPI: 69 year old female here for evaluation of foot pain.  April 2019 patient noticed onset of bilateral foot pain, numbness and tingling.  This affected left more than right side.  Around that time she had started to use a small trampoline for exercise.  Patient initially went to podiatry for evaluation.  She then went to sports medicine and PCP as well.  By July 2019 symptoms had progressed up to her ankle and calf regions.  At some point she noticed some intermittent tingling in her fingers.    Patient tried a variety of interventions including changing her medications and supplements, eliminating gluten from diet, and started low-dose gabapentin 100 in the morning and 200 at night.  Since January 2020 patient is noticed symptoms have gradually improved.    REVIEW OF SYSTEMS: Full 14 system review of systems performed and negative with exception of: Head tremor.  ALLERGIES: Allergies  Allergen Reactions  . Boniva [Ibandronic Acid] Nausea Only  . Codeine     REACTION: vomiting    HOME MEDICATIONS: Outpatient Medications Prior to Visit  Medication Sig Dispense Refill  . Calcium Carbonate-Vit D-Min (CALCIUM 1200 PO) Take 1,200 Units by mouth daily.    . cetirizine (ZYRTEC) 10 MG tablet Take 10 mg by mouth as needed for allergies. Reported on 01/23/2016      . Cholecalciferol (D3) 50 MCG (2000 UT) TABS Take 1 tablet by mouth daily.    . clonazePAM (KLONOPIN) 0.5 MG tablet Take 0.5 mg by mouth 2 (two) times daily as needed for anxiety.    . gabapentin (NEURONTIN) 100 MG capsule Take 1 capsule in the AM & 2 capsules at bedtime. 270 capsule 0  . IRON CR PO Take 65 mg by mouth. 2-3 x weekly    . Multiple Vitamins-Minerals (OSTEOPRIME ULTRA PO) Take by mouth.    . vitamin C (ASCORBIC ACID) 500 MG tablet Take 500 mg by mouth daily.    . Cholecalciferol (VITAMIN D3 PO) Take 5,000 Units by mouth daily.    Marland Kitchen MAGNESIUM CARBONATE PO Take 350 mg by mouth daily.    . OMEGA-3 FATTY ACIDS PO Take by mouth. 1280 mg daily     No facility-administered medications prior to visit.    PAST MEDICAL HISTORY: Past Medical History:  Diagnosis Date  . Anemia   . Anxiety   . Osteoporosis   . Seasonal allergies   . Vitamin D deficiency     PAST SURGICAL HISTORY: Past Surgical History:  Procedure Laterality Date  . 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 APPENDECTOMY    . BLADDER SUSPENSION    . BUNIONECTOMY Right   . LASIK Bilateral 2001  . TONSILLECTOMY      FAMILY HISTORY: Family History  Problem Relation Age of Onset  . Heart failure  Mother   . Kidney failure Mother   . Thyroid cancer Mother   . Hypertension Mother   . Heart failure Father   . Melanoma Father   . Hypertension Father   . Tremor Father   . Osteoporosis Sister   . Stroke Sister   . Melanoma Sister   . Other Sister        fibromuscular dysphasia of vertebral L carotid arteries  . Heart attack Maternal Grandmother   . Heart failure Maternal Grandmother   . Melanoma Sister   . Thyroid cancer Sister   . Osteoporosis Sister   . Colon cancer Neg Hx     SOCIAL HISTORY: Social History   Socioeconomic History  . Marital status: Married    Spouse name: [name]  . Number of children: 2  . Years of education: Not on file  . Highest  education level: Master's degree (e.g., MA, MS, MEng, MEd, MSW, MBA)  Occupational History    Comment: school teacher  Tobacco Use  . Smoking status: Never Smoker  . Smokeless tobacco: Never Used  Substance and Sexual Activity  . Alcohol use: Yes    Alcohol/week: 7.0 standard drinks    Types: 7 Glasses of wine per week    Comment: 1 glass wine daily  . Drug use: No  . Sexual activity: Not on file  Other Topics Concern  . Not on file  Social History Narrative   Lives with husband   Caffeine- coffee 2 daily   Social Determinants of Health   Financial Resource Strain:   . Difficulty of Paying Living Expenses:   Food Insecurity:   . Worried About Charity fundraiser in the Last Year:   . Arboriculturist in the Last Year:   Transportation Needs:   . Film/video editor (Medical):   Marland Kitchen Lack of Transportation (Non-Medical):   Physical Activity:   . Days of Exercise per Week:   . Minutes of Exercise per Session:   Stress:   . Feeling of Stress :   Social Connections:   . Frequency of Communication with Friends and Family:   . Frequency of Social Gatherings with Friends and Family:   . Attends Religious Services:   . Active Member of Clubs or Organizations:   . Attends Archivist Meetings:   Marland Kitchen Marital Status:   Intimate Partner Violence:   . Fear of Current or Ex-Partner:   . Emotionally Abused:   Marland Kitchen Physically Abused:   . Sexually Abused:      PHYSICAL EXAM  GENERAL EXAM/CONSTITUTIONAL: Vitals:  Vitals:   06/11/20 1318  BP: 127/76  Pulse: (!) 59  Weight: 130 lb 12.8 oz (59.3 kg)  Height: 5\' 8"  (1.727 m)   Body mass index is 19.89 kg/m. Wt Readings from Last 3 Encounters:  06/11/20 130 lb 12.8 oz (59.3 kg)  02/20/19 136 lb 3.2 oz (61.8 kg)  11/16/18 135 lb (61.2 kg)    Patient is in no distress; well developed, nourished and groomed; neck is supple  CARDIOVASCULAR:  Examination of carotid arteries is normal; no carotid bruits  Regular rate  and rhythm, no murmurs  Examination of peripheral vascular system by observation and palpation is normal  EYES:  Ophthalmoscopic exam of optic discs and posterior segments is normal; no papilledema or hemorrhages No exam data present  MUSCULOSKELETAL:  Gait, strength, tone, movements noted in Neurologic exam below  NEUROLOGIC: MENTAL STATUS:  No flowsheet data found.  awake, alert, oriented  to person, place and time  recent and remote memory intact  normal attention and concentration  language fluent, comprehension intact, naming intact  fund of knowledge appropriate  CRANIAL NERVE:   2nd - no papilledema on fundoscopic exam  2nd, 3rd, 4th, 6th - pupils equal and reactive to light, visual fields full to confrontation, extraocular muscles intact, no nystagmus  5th - facial sensation symmetric  7th - facial strength symmetric  8th - hearing intact  9th - palate elevates symmetrically, uvula midline  11th - shoulder shrug symmetric  12th - tongue protrusion midline  MOTOR:   normal bulk and tone, full strength in the BUE, BLE  SENSORY:   normal and symmetric to light touch, pinprick, temperature, vibration  COORDINATION:   finger-nose-finger, fine finger movements normal  REFLEXES:   deep tendon reflexes 1+ and symmetric; TRACE AT ANKLES  GAIT/STATION:   narrow based gait     DIAGNOSTIC DATA (LABS, IMAGING, TESTING) - I reviewed patient records, labs, notes, testing and imaging myself where available.  Lab Results  Component Value Date   WBC 6.6 09/26/2018   HGB 12.8 09/26/2018   HCT 37.8 09/26/2018   MCV 92.6 09/26/2018   PLT 216.0 09/26/2018      Component Value Date/Time   NA 138 09/26/2018 1608   K 4.1 09/26/2018 1608   CL 102 09/26/2018 1608   CO2 30 09/26/2018 1608   GLUCOSE 82 09/26/2018 1608   BUN 21 09/26/2018 1608   CREATININE 0.83 09/26/2018 1608   CALCIUM 8.7 09/26/2018 1608   CALCIUM 9.4 09/26/2018 1608   PROT 7.2  09/26/2018 1608   ALBUMIN 4.6 09/26/2018 1608   AST 28 09/26/2018 1608   ALT 17 09/26/2018 1608   ALKPHOS 52 09/26/2018 1608   BILITOT 0.4 09/26/2018 1608   No results found for: CHOL, HDL, LDLCALC, LDLDIRECT, TRIG, CHOLHDL No results found for: HGBA1C Lab Results  Component Value Date   XAJOINOM76 720 09/26/2018   Lab Results  Component Value Date   TSH 1.88 09/26/2018    10/27/18 MRI lumbar spine [I reviewed images myself and agree with interpretation. -VRP]  1. No fracture or acute osseous process. Minimal grade 1 L2-3 retrolisthesis without spondylolysis. 2. LEFT extraforaminal L3-4 disc protrusion may affect the exited LEFT L3 nerve. 3. No canal stenosis. Minimal to mild L3-4 and L4-5 neural foraminal narrowing.    ASSESSMENT AND PLAN  69 y.o. year old female here with new onset of numbness, prickly sensation, stabbing and cold sensation in the feet and ankles, slightly in the fingers, since April 2019.  Symptoms now improving since January 2020.  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  Symptoms are gradually improving and therefore will monitor for now.  May consider additional neuropathy work-up if symptoms worsen.   Ddx: neuropathy (post-viral, idiopathic, hereditary, metabolic, autoimmune)  1. Neuropathic pain      PLAN:  NEUROPATHIC PAIN (improving) - monitor sxs - consider neuropathy labs and EMG in future if sxs worsen - continue tapering gabapentin off  Return for pending if symptoms worsen or fail to improve.    Penni Bombard, MD 9/47/0962, 8:36 PM Certified in Neurology, Neurophysiology and Neuroimaging  [facility] [address] [place], [place] [address] [phone]

## 2020-07-14 DIAGNOSIS — Z1231 Encounter for screening mammogram for malignant neoplasm of breast: Secondary | ICD-10-CM | POA: Diagnosis not present

## 2020-08-05 DIAGNOSIS — H5203 Hypermetropia, bilateral: Secondary | ICD-10-CM | POA: Diagnosis not present

## 2020-08-05 DIAGNOSIS — Z961 Presence of intraocular lens: Secondary | ICD-10-CM | POA: Diagnosis not present

## 2020-08-12 DIAGNOSIS — L309 Dermatitis, unspecified: Secondary | ICD-10-CM | POA: Diagnosis not present

## 2020-10-28 DIAGNOSIS — H0015 Chalazion left lower eyelid: Secondary | ICD-10-CM | POA: Diagnosis not present

## 2020-11-24 DIAGNOSIS — D2261 Melanocytic nevi of right upper limb, including shoulder: Secondary | ICD-10-CM | POA: Diagnosis not present

## 2020-11-24 DIAGNOSIS — D2371 Other benign neoplasm of skin of right lower limb, including hip: Secondary | ICD-10-CM | POA: Diagnosis not present

## 2020-11-24 DIAGNOSIS — D2262 Melanocytic nevi of left upper limb, including shoulder: Secondary | ICD-10-CM | POA: Diagnosis not present

## 2020-11-24 DIAGNOSIS — L821 Other seborrheic keratosis: Secondary | ICD-10-CM | POA: Diagnosis not present

## 2020-11-24 DIAGNOSIS — L718 Other rosacea: Secondary | ICD-10-CM | POA: Diagnosis not present

## 2020-12-30 ENCOUNTER — Ambulatory Visit: Payer: PPO | Admitting: Family Medicine

## 2021-05-15 DIAGNOSIS — R030 Elevated blood-pressure reading, without diagnosis of hypertension: Secondary | ICD-10-CM | POA: Diagnosis not present

## 2021-05-15 DIAGNOSIS — H6982 Other specified disorders of Eustachian tube, left ear: Secondary | ICD-10-CM | POA: Diagnosis not present

## 2021-05-15 DIAGNOSIS — H9202 Otalgia, left ear: Secondary | ICD-10-CM | POA: Diagnosis not present

## 2021-06-03 DIAGNOSIS — E7800 Pure hypercholesterolemia, unspecified: Secondary | ICD-10-CM | POA: Diagnosis not present

## 2021-06-03 DIAGNOSIS — M810 Age-related osteoporosis without current pathological fracture: Secondary | ICD-10-CM | POA: Diagnosis not present

## 2021-06-03 DIAGNOSIS — Z0000 Encounter for general adult medical examination without abnormal findings: Secondary | ICD-10-CM | POA: Diagnosis not present

## 2021-06-03 DIAGNOSIS — Z1389 Encounter for screening for other disorder: Secondary | ICD-10-CM | POA: Diagnosis not present

## 2021-08-01 DIAGNOSIS — Z1231 Encounter for screening mammogram for malignant neoplasm of breast: Secondary | ICD-10-CM | POA: Diagnosis not present

## 2021-08-07 DIAGNOSIS — H5203 Hypermetropia, bilateral: Secondary | ICD-10-CM | POA: Diagnosis not present

## 2021-08-07 DIAGNOSIS — H2513 Age-related nuclear cataract, bilateral: Secondary | ICD-10-CM | POA: Diagnosis not present

## 2021-11-24 DIAGNOSIS — D2261 Melanocytic nevi of right upper limb, including shoulder: Secondary | ICD-10-CM | POA: Diagnosis not present

## 2021-11-24 DIAGNOSIS — D2272 Melanocytic nevi of left lower limb, including hip: Secondary | ICD-10-CM | POA: Diagnosis not present

## 2021-11-24 DIAGNOSIS — D2371 Other benign neoplasm of skin of right lower limb, including hip: Secondary | ICD-10-CM | POA: Diagnosis not present

## 2021-11-24 DIAGNOSIS — H02729 Madarosis of unspecified eye, unspecified eyelid and periocular area: Secondary | ICD-10-CM | POA: Diagnosis not present

## 2021-11-24 DIAGNOSIS — L821 Other seborrheic keratosis: Secondary | ICD-10-CM | POA: Diagnosis not present

## 2021-11-24 DIAGNOSIS — D2262 Melanocytic nevi of left upper limb, including shoulder: Secondary | ICD-10-CM | POA: Diagnosis not present

## 2021-11-24 DIAGNOSIS — L661 Lichen planopilaris: Secondary | ICD-10-CM | POA: Diagnosis not present

## 2021-11-24 DIAGNOSIS — D225 Melanocytic nevi of trunk: Secondary | ICD-10-CM | POA: Diagnosis not present

## 2022-01-26 DIAGNOSIS — L661 Lichen planopilaris: Secondary | ICD-10-CM | POA: Diagnosis not present

## 2022-03-29 DIAGNOSIS — Z780 Asymptomatic menopausal state: Secondary | ICD-10-CM | POA: Diagnosis not present

## 2022-03-29 DIAGNOSIS — M810 Age-related osteoporosis without current pathological fracture: Secondary | ICD-10-CM | POA: Diagnosis not present

## 2022-03-29 DIAGNOSIS — M85851 Other specified disorders of bone density and structure, right thigh: Secondary | ICD-10-CM | POA: Diagnosis not present

## 2022-04-06 DIAGNOSIS — L821 Other seborrheic keratosis: Secondary | ICD-10-CM | POA: Diagnosis not present

## 2022-04-06 DIAGNOSIS — L661 Lichen planopilaris: Secondary | ICD-10-CM | POA: Diagnosis not present

## 2022-05-18 NOTE — Progress Notes (Addendum)
[facility] [place] Phone: [phone] Subjective:   [name], am serving as a scribe for Dr. Hulan Saas.   I'm seeing this patient by the request  of:  Carol Ada, MD  CC: Right and left knee pain  [id number]  [name] is a 71 y.o. female coming in with complaint of R knee pain. Last seen in 2020. Patient states that 5 months ago she hit her knee on a desk and develop pain that caused her to back off of activity. After that began to train for 1/2 marathon and her knees started bothering her. She took a break and pain subsided and she did complete 1/2 marathon (walking). Tried jogging recently and this caused her pain to increase and not subside. Patient backed off of activity again and now the L knee is painful over medial aspect. R knee pain is typically over lateral aspect. Notes instability with descending stairs in both knees.     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   APPENDECTOMY     BLADDER SUSPENSION     BUNIONECTOMY Right    LASIK Bilateral 2001   TONSILLECTOMY     Social History   Socioeconomic History   Marital status: Married    Spouse name: [name]   Number of children: 2   Years of education: Not on file   Highest education level: Master's degree (e.g., MA, MS, MEng, MEd, MSW, MBA)  Occupational History    Comment: school teacher  Tobacco Use   Smoking status: Never   Smokeless tobacco: Never  Substance and Sexual Activity   Alcohol use: Yes    Alcohol/week: 7.0 standard drinks    Types: 7 Glasses of wine per week    Comment: 1 glass wine daily   Drug use: Wolf   Sexual activity: Not on file  Other Topics Concern   Not on file  Social History Narrative    Lives with husband   Caffeine- coffee 2 daily   Social Determinants of Health   Financial Resource Strain: Not on file  Food Insecurity: Not on file  Transportation Needs: Not on file  Physical Activity: Not on file  Stress: Not on file  Social Connections: Not on file   Allergies  Allergen Reactions   Boniva [Ibandronic Acid] Nausea Only   Codeine     REACTION: vomiting   Family History  Problem Relation Age of Onset   Heart failure Mother    Kidney failure Mother    Thyroid cancer Mother    Hypertension Mother    Heart failure Father    Melanoma Father    Hypertension Father    Tremor Father    Osteoporosis Sister    Stroke Sister    Melanoma Sister    Other Sister        fibromuscular dysphasia of vertebral L carotid arteries   Heart attack Maternal Grandmother    Heart failure Maternal Grandmother    Melanoma Sister    Thyroid cancer Sister    Osteoporosis Sister    Colon cancer Neg Hx       Current Outpatient Medications (Respiratory):    cetirizine (ZYRTEC) 10 MG  tablet, Take 10 mg by mouth as needed for allergies. Reported on 01/23/2016   Current Outpatient Medications (Hematological):    IRON CR PO, Take 65 mg by mouth. 2-3 x weekly  Current Outpatient Medications (Other):    Calcium Carbonate-Vit D-Min (CALCIUM 1200 PO), Take 1,200 Units by mouth daily.   Cholecalciferol (D3) 50 MCG (2000 UT) TABS, Take 1 tablet by mouth daily.   clonazePAM (KLONOPIN) 0.5 MG tablet, Take 0.5 mg by mouth 2 (two) times daily as needed for anxiety.   Multiple Vitamins-Minerals (OSTEOPRIME ULTRA PO), Take by mouth.   vitamin C (ASCORBIC ACID) 500 MG tablet, Take 500 mg by mouth daily.   Reviewed prior external information including notes and imaging from  primary care provider As well as notes that were available from care everywhere and other healthcare systems.  Past medical history, social, surgical and family history all reviewed in electronic medical record.  Wolf  pertanent information unless stated regarding to the chief complaint.   Review of Systems:  Wolf headache, visual changes, nausea, vomiting, diarrhea, constipation, dizziness, abdominal pain, skin rash, fevers, chills, night sweats, weight loss, swollen lymph nodes,chest pain, shortness of breath, mood changes. POSITIVE muscle aches, body aches, joint swelling, has had difficulty with scalp recently   Objective  Blood pressure 108/72, pulse 67, height '5\' 8"'$  (1.727 m), weight 134 lb (60.8 kg), SpO2 97 %.   General: Wolf apparent distress alert and oriented x3 mood and affect normal, dressed appropriately.  HEENT: Pupils equal, extraocular movements intact alar rash noted  Respiratory: Patient's speak in full sentences and does not appear short of breath  Cardiovascular: Wolf lower extremity edema, non tender, Wolf erythema  Gait severely antalgic patient's left knee does have effusion noted.  Right knee is warm to the touch though.  Patient lacks the last 5 degrees of extension and flexion.  Wolf significant instability but mild crepitus noted.  Limited muscular skeletal ultrasound was performed and interpreted by Hulan Saas, M  Limited ultrasound of patient's knee show the patient does have hypoechoic changes consistent with effusion left greater than right.  Patient also has findings consistent with a fairly large synovitis.  Mild increase in Doppler flow of these synovium.  Wolf overlying soft tissue changes.  Very mild narrowing of the patellofemoral joint in the medial joint space. Impression: Bilateral synovitis of the knees  After informed written and verbal consent, patient was seated on exam table. Right knee was prepped with alcohol swab and utilizing anterolateral approach, patient's right knee space was injected with 4:1  marcaine 0.5%: Kenalog '40mg'$ /dL. Patient tolerated the procedure well without immediate complications.  After informed written and verbal consent, patient was seated on exam  table. Left knee was prepped with alcohol swab and utilizing anterolateral approach, patient's left knee space was injected with 4:1  marcaine 0.5%: Kenalog '40mg'$ /dL. Patient tolerated the procedure well without immediate complications.    Impression and Recommendations:     The above documentation has been reviewed and is accurate and complete Lyndal Pulley, DO

## 2022-05-19 ENCOUNTER — Encounter: Payer: Self-pay | Admitting: Family Medicine

## 2022-05-19 ENCOUNTER — Ambulatory Visit: Payer: Self-pay

## 2022-05-19 ENCOUNTER — Ambulatory Visit (INDEPENDENT_AMBULATORY_CARE_PROVIDER_SITE_OTHER): Payer: PPO | Admitting: Family Medicine

## 2022-05-19 ENCOUNTER — Ambulatory Visit (INDEPENDENT_AMBULATORY_CARE_PROVIDER_SITE_OTHER): Payer: PPO

## 2022-05-19 VITALS — BP 108/72 | HR 67 | Ht 68.0 in | Wt 134.0 lb

## 2022-05-19 DIAGNOSIS — M65862 Other synovitis and tenosynovitis, left lower leg: Secondary | ICD-10-CM

## 2022-05-19 DIAGNOSIS — M25561 Pain in right knee: Secondary | ICD-10-CM

## 2022-05-19 DIAGNOSIS — M25562 Pain in left knee: Secondary | ICD-10-CM

## 2022-05-19 DIAGNOSIS — M65861 Other synovitis and tenosynovitis, right lower leg: Secondary | ICD-10-CM

## 2022-05-19 DIAGNOSIS — M659 Synovitis and tenosynovitis, unspecified: Secondary | ICD-10-CM | POA: Diagnosis not present

## 2022-05-19 DIAGNOSIS — M2550 Pain in unspecified joint: Secondary | ICD-10-CM

## 2022-05-19 LAB — COMPREHENSIVE METABOLIC PANEL
ALT: 14 U/L (ref 0–35)
AST: 24 U/L (ref 0–37)
Albumin: 4.7 g/dL (ref 3.5–5.2)
Alkaline Phosphatase: 73 U/L (ref 39–117)
BUN: 24 mg/dL — ABNORMAL HIGH (ref 6–23)
CO2: 31 mEq/L (ref 19–32)
Calcium: 10 mg/dL (ref 8.4–10.5)
Chloride: 100 mEq/L (ref 96–112)
Creatinine, Ser: 0.85 mg/dL (ref 0.40–1.20)
GFR: 70.3 mL/min (ref >60.00)
Glucose, Bld: 86 mg/dL (ref 70–99)
Potassium: 4.2 mEq/L (ref 3.5–5.1)
Sodium: 138 mEq/L (ref 135–145)
Total Bilirubin: 0.4 mg/dL (ref 0.2–1.2)
Total Protein: 7.6 g/dL (ref 6.0–8.3)

## 2022-05-19 LAB — CBC WITH DIFFERENTIAL/PLATELET
Basophils Absolute: 0 10*3/uL (ref 0.0–0.1)
Basophils Relative: 0.7 % (ref 0.0–3.0)
Eosinophils Absolute: 0.1 10*3/uL (ref 0.0–0.7)
Eosinophils Relative: 1 % (ref 0.0–5.0)
HCT: 38.1 % (ref 36.0–46.0)
Hemoglobin: 12.8 g/dL (ref 12.0–15.0)
Lymphocytes Relative: 39.6 % (ref 12.0–46.0)
Lymphs Abs: 2.8 10*3/uL (ref 0.7–4.0)
MCHC: 33.6 g/dL (ref 30.0–36.0)
MCV: 91.8 fl (ref 78.0–100.0)
Monocytes Absolute: 0.5 10*3/uL (ref 0.1–1.0)
Monocytes Relative: 7.6 % (ref 3.0–12.0)
Neutro Abs: 3.6 10*3/uL (ref 1.4–7.7)
Neutrophils Relative %: 51.1 % (ref 43.0–77.0)
Platelets: 209 10*3/uL (ref 150.0–400.0)
RBC: 4.16 Mil/uL (ref 3.87–5.11)
RDW: 12.9 % (ref 11.5–15.5)
WBC: 7 10*3/uL (ref 4.0–10.5)

## 2022-05-19 LAB — C-REACTIVE PROTEIN: CRP: <1 mg/dL (ref 0.5–20.0)

## 2022-05-19 LAB — TSH: TSH: 2.67 u[IU]/mL (ref 0.35–5.50)

## 2022-05-19 LAB — IBC PANEL
Iron: 57 ug/dL (ref 42–145)
Saturation Ratios: 16.7 % — ABNORMAL LOW (ref 20.0–50.0)
TIBC: 341.6 ug/dL (ref 250.0–450.0)
Transferrin: 244 mg/dL (ref 212.0–360.0)

## 2022-05-19 LAB — VITAMIN D 25 HYDROXY (VIT D DEFICIENCY, FRACTURES): VITD: 44.91 ng/mL (ref 30.00–100.00)

## 2022-05-19 LAB — URIC ACID: Uric Acid, Serum: 3.7 mg/dL (ref 2.4–7.0)

## 2022-05-19 LAB — SEDIMENTATION RATE: Sed Rate: 10 mm/hr (ref 0–30)

## 2022-05-19 LAB — FERRITIN: Ferritin: 32.4 ng/mL (ref 10.0–291.0)

## 2022-05-19 NOTE — Patient Instructions (Addendum)
Xray  All labs Exercises See me in 7-8 weeks

## 2022-05-20 ENCOUNTER — Telehealth: Payer: Self-pay | Admitting: Family Medicine

## 2022-05-20 DIAGNOSIS — M65969 Unspecified synovitis and tenosynovitis, unspecified lower leg: Secondary | ICD-10-CM | POA: Insufficient documentation

## 2022-05-20 DIAGNOSIS — M659 Synovitis and tenosynovitis, unspecified: Secondary | ICD-10-CM | POA: Insufficient documentation

## 2022-05-20 NOTE — Assessment & Plan Note (Signed)
Patient on ultrasound seem to have some fairly severe synovitis of the knees left greater than right.  Patient does have some mild alar rash noted and has had some other muscle discomfort for some time so we will get laboratory work-up to further evaluate for any autoimmune that could be contributing.  No signs of any infectious etiology.  Gout is within the differential.  Hopefully patient responds to the steroid injection.  X-rays are pending.  Follow-up again in 4 to 6 weeks

## 2022-05-20 NOTE — Telephone Encounter (Signed)
Patient called stating that's he was seen yesterday but wanted to clarify about exercising.  She said that she thinks she remembers Dr Tamala Julian saying that she should no non impact exercises (biking, elliptical, etc) through [facility] and then after [facility] so she can back to her more normal activities (walking, yoga, etc). Is this correct?  Please advise.

## 2022-05-20 NOTE — Telephone Encounter (Signed)
Spoke with patient.

## 2022-05-21 LAB — RHEUMATOID FACTOR: Rheumatoid fact SerPl-aCnc: <14 IU/mL (ref <14)

## 2022-05-21 LAB — CYCLIC CITRUL PEPTIDE ANTIBODY, IGG: Cyclic Citrullin Peptide Ab: <16 UNITS

## 2022-05-21 LAB — ANGIOTENSIN CONVERTING ENZYME: Angiotensin-Converting Enzyme: 53 U/L (ref 9–67)

## 2022-05-21 LAB — PTH, INTACT AND CALCIUM
Calcium: 10.1 mg/dL (ref 8.6–10.4)
PTH: 20 pg/mL (ref 16–77)

## 2022-05-21 LAB — ANTI-NUCLEAR AB-TITER (ANA TITER): ANA Titer 1: 1:40 {titer} — ABNORMAL HIGH

## 2022-05-21 LAB — CALCIUM, IONIZED: Calcium, Ion: 5.1 mg/dL (ref 4.7–5.5)

## 2022-05-21 LAB — ANTI-DNA ANTIBODY, DOUBLE-STRANDED: ds DNA Ab: <1 IU/mL

## 2022-05-21 LAB — ANA: Anti Nuclear Antibody (ANA): POSITIVE — AB

## 2022-06-14 DIAGNOSIS — E559 Vitamin D deficiency, unspecified: Secondary | ICD-10-CM | POA: Diagnosis not present

## 2022-06-14 DIAGNOSIS — E7800 Pure hypercholesterolemia, unspecified: Secondary | ICD-10-CM | POA: Diagnosis not present

## 2022-06-14 DIAGNOSIS — Z1331 Encounter for screening for depression: Secondary | ICD-10-CM | POA: Diagnosis not present

## 2022-06-14 DIAGNOSIS — M810 Age-related osteoporosis without current pathological fracture: Secondary | ICD-10-CM | POA: Diagnosis not present

## 2022-06-14 DIAGNOSIS — Z0000 Encounter for general adult medical examination without abnormal findings: Secondary | ICD-10-CM | POA: Diagnosis not present

## 2022-07-01 NOTE — Progress Notes (Deleted)
[place] Phone: [phone] Subjective:    I'm seeing this patient by the request  of:  Carol Ada, MD  CC:   [id number]  05/19/2022 Patient on ultrasound seem to have some fairly severe synovitis of the knees left greater than right.  Patient does have some mild alar rash noted and has had some other muscle discomfort for some time so we will get laboratory work-up to further evaluate for any autoimmune that could be contributing.  No signs of any infectious etiology.  Gout is within the differential.  Hopefully patient responds to the steroid injection.  X-rays are pending.  Follow-up again in 4 to 6 weeks  Updated 7/12/203 [name] is a 71 y.o. female coming in with complaint of knee pain  Xray IMPRESSION: 1. No acute bony abnormality. 2. Minimal degenerative changes of the medial knees.     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   APPENDECTOMY     BLADDER SUSPENSION     BUNIONECTOMY Right    LASIK Bilateral 2001   TONSILLECTOMY     Social History   Socioeconomic History   Marital status: Married    Spouse name: [name]   Number of children: 2   Years of education: Not on file   Highest education level: Master's degree (e.g., MA, MS, MEng, MEd, MSW, MBA)  Occupational History    Comment: school teacher  Tobacco Use   Smoking status: Never   Smokeless tobacco: Never  Substance and Sexual Activity   Alcohol use: Yes    Alcohol/week: 7.0 standard drinks of alcohol    Types: 7 Glasses of wine per week    Comment: 1 glass wine daily   Drug use: No   Sexual activity: Not on file  Other Topics Concern   Not on file  Social History Narrative   Lives with husband    Caffeine- coffee 2 daily   Social Determinants of Health   Financial Resource Strain: Not on file  Food Insecurity: Not on file  Transportation Needs: Not on file  Physical Activity: Not on file  Stress: Not on file  Social Connections: Not on file   Allergies  Allergen Reactions   Boniva [Ibandronic Acid] Nausea Only   Codeine     REACTION: vomiting   Family History  Problem Relation Age of Onset   Heart failure Mother    Kidney failure Mother    Thyroid cancer Mother    Hypertension Mother    Heart failure Father    Melanoma Father    Hypertension Father    Tremor Father    Osteoporosis Sister    Stroke Sister    Melanoma Sister    Other Sister        fibromuscular dysphasia of vertebral L carotid arteries   Heart attack Maternal Grandmother    Heart failure Maternal Grandmother    Melanoma Sister    Thyroid cancer Sister    Osteoporosis Sister    Colon cancer Neg Hx       Current Outpatient Medications (Respiratory):    cetirizine (ZYRTEC) 10 MG tablet, Take 10 mg by mouth as needed for allergies. Reported on  01/23/2016   Current Outpatient Medications (Hematological):    IRON CR PO, Take 65 mg by mouth. 2-3 x weekly  Current Outpatient Medications (Other):    Calcium Carbonate-Vit D-Min (CALCIUM 1200 PO), Take 1,200 Units by mouth daily.   Cholecalciferol (D3) 50 MCG (2000 UT) TABS, Take 1 tablet by mouth daily.   clonazePAM (KLONOPIN) 0.5 MG tablet, Take 0.5 mg by mouth 2 (two) times daily as needed for anxiety.   Multiple Vitamins-Minerals (OSTEOPRIME ULTRA PO), Take by mouth.   vitamin C (ASCORBIC ACID) 500 MG tablet, Take 500 mg by mouth daily.   Reviewed prior external information including notes and imaging from  primary care provider As well as notes that were available from care everywhere and other healthcare systems.  Past medical history, social, surgical and family history all reviewed in electronic medical record.  No pertanent information  unless stated regarding to the chief complaint.   Review of Systems:  No headache, visual changes, nausea, vomiting, diarrhea, constipation, dizziness, abdominal pain, skin rash, fevers, chills, night sweats, weight loss, swollen lymph nodes, body aches, joint swelling, chest pain, shortness of breath, mood changes. POSITIVE muscle aches  Objective  There were no vitals taken for this visit.   General: No apparent distress alert and oriented x3 mood and affect normal, dressed appropriately.  HEENT: Pupils equal, extraocular movements intact  Respiratory: Patient's speak in full sentences and does not appear short of breath  Cardiovascular: No lower extremity edema, non tender, no erythema      Impression and Recommendations:

## 2022-07-07 ENCOUNTER — Ambulatory Visit: Payer: PPO | Admitting: Family Medicine

## 2022-07-20 DIAGNOSIS — Z01419 Encounter for gynecological examination (general) (routine) without abnormal findings: Secondary | ICD-10-CM | POA: Diagnosis not present

## 2022-07-20 NOTE — Progress Notes (Signed)
[name] [address] [place] Phone: [phone] Subjective:   [name], am serving as a scribe for Dr. Hulan Saas.  I'm seeing this patient by the request  of:  Carol Ada, MD  CC: Knee pain follow-up  [id number]  05/19/2022 Patient on ultrasound seem to have some fairly severe synovitis of the knees left greater than right.  Patient does have some mild alar rash noted and has had some other muscle discomfort for some time so we will get laboratory work-up to further evaluate for any autoimmune that could be contributing.  No signs of any infectious etiology.  Gout is within the differential.  Hopefully patient responds to the steroid injection.  X-rays are pending.  Follow-up again in 4 to 6 weeks  Update 07/21/2022 [name] is a 71 y.o. female coming in with complaint of L knee pain. Patient states much better since injection. Stingy sporadically. No other complaints.  Xray 05/18/2022 B standing knee IMPRESSION: 1. No acute bony abnormality. 2. Minimal degenerative changes of the medial knees.  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   APPENDECTOMY     BLADDER SUSPENSION     BUNIONECTOMY Right    LASIK Bilateral 2001   TONSILLECTOMY     Social History   Socioeconomic History   Marital status: Married    Spouse name: [name]   Number of children: 2   Years of education: Not on file   Highest education level: Master's degree (e.g., MA, MS, MEng, MEd, MSW, MBA)  Occupational History    Comment: school teacher  Tobacco Use   Smoking status: Never   Smokeless tobacco: Never  Substance and Sexual Activity   Alcohol use: Yes    Alcohol/week: 7.0 standard drinks of alcohol    Types: 7  Glasses of wine per week    Comment: 1 glass wine daily   Drug use: No   Sexual activity: Not on file  Other Topics Concern   Not on file  Social History Narrative   Lives with husband   Caffeine- coffee 2 daily   Social Determinants of Health   Financial Resource Strain: Not on file  Food Insecurity: Not on file  Transportation Needs: Not on file  Physical Activity: Not on file  Stress: Not on file  Social Connections: Not on file   Allergies  Allergen Reactions   Boniva [Ibandronic Acid] Nausea Only   Codeine     REACTION: vomiting   Family History  Problem Relation Age of Onset   Heart failure Mother    Kidney failure Mother    Thyroid cancer Mother    Hypertension Mother    Heart failure Father    Melanoma Father    Hypertension Father    Tremor Father    Osteoporosis Sister    Stroke Sister    Melanoma Sister    Other Sister        fibromuscular dysphasia of vertebral L carotid arteries   Heart attack Maternal Grandmother    Heart failure Maternal Grandmother    Melanoma Sister    Thyroid cancer Sister    Osteoporosis Sister    Colon cancer Neg Hx  Current Outpatient Medications (Respiratory):    cetirizine (ZYRTEC) 10 MG tablet, Take 10 mg by mouth as needed for allergies. Reported on 01/23/2016   Current Outpatient Medications (Hematological):    IRON CR PO, Take 65 mg by mouth. 2-3 x weekly  Current Outpatient Medications (Other):    Calcium Carbonate-Vit D-Min (CALCIUM 1200 PO), Take 1,200 Units by mouth daily.   Cholecalciferol (D3) 50 MCG (2000 UT) TABS, Take 1 tablet by mouth daily.   clonazePAM (KLONOPIN) 0.5 MG tablet, Take 0.5 mg by mouth 2 (two) times daily as needed for anxiety.   Multiple Vitamins-Minerals (OSTEOPRIME ULTRA PO), Take by mouth.   vitamin C (ASCORBIC ACID) 500 MG tablet, Take 500 mg by mouth daily.    Objective  Blood pressure 136/86, pulse 98, height '5\' 8"'$  (1.727 m), weight 134 lb (60.8 kg), SpO2 (!) 66 %.    General: No apparent distress alert and oriented x3 mood and affect normal, dressed appropriately.  HEENT: Pupils equal, extraocular movements intact  Respiratory: Patient's speak in full sentences and does not appear short of breath  Cardiovascular: No lower extremity edema, non tender, no erythema  Patient's knee exam has some very mild crepitus noted.  No significant effusion bilaterally.  Patient has negative pain with flexion or extension.  No significant instability noted  Limited muscular skeletal ultrasound was performed and interpreted by Hulan Saas, M Limited ultrasound shows no hypoechoic changes.  No enlargement of the synovial lining at the moment.  Mild narrowing of the medial and patellofemoral joints bilaterally. Impression: Interval improvement    Impression and Recommendations:     The above documentation has been reviewed and is accurate and complete Rita Pulley, DO

## 2022-07-21 ENCOUNTER — Ambulatory Visit: Payer: Self-pay

## 2022-07-21 ENCOUNTER — Ambulatory Visit (INDEPENDENT_AMBULATORY_CARE_PROVIDER_SITE_OTHER): Payer: PPO | Admitting: Family Medicine

## 2022-07-21 ENCOUNTER — Encounter: Payer: Self-pay | Admitting: Family Medicine

## 2022-07-21 VITALS — BP 136/86 | HR 98 | Ht 68.0 in | Wt 134.0 lb

## 2022-07-21 DIAGNOSIS — M659 Synovitis and tenosynovitis, unspecified: Secondary | ICD-10-CM

## 2022-07-21 NOTE — Patient Instructions (Signed)
Good to see you! See us when you need us 

## 2022-07-21 NOTE — Assessment & Plan Note (Signed)
Patient has no significant synovitis at the moment.  Seems to be responding really well to the conservative changes.  No significant arthritic changes noted on x-rays either.  Patient hopefully will continue to respond well to the conservative therapy and follow-up with me as needed

## 2022-08-10 DIAGNOSIS — H2513 Age-related nuclear cataract, bilateral: Secondary | ICD-10-CM | POA: Diagnosis not present

## 2022-08-10 DIAGNOSIS — D3132 Benign neoplasm of left choroid: Secondary | ICD-10-CM | POA: Diagnosis not present

## 2022-08-16 DIAGNOSIS — Z1231 Encounter for screening mammogram for malignant neoplasm of breast: Secondary | ICD-10-CM | POA: Diagnosis not present

## 2022-09-22 ENCOUNTER — Ambulatory Visit: Payer: PPO | Admitting: Family Medicine

## 2022-11-24 DIAGNOSIS — D2261 Melanocytic nevi of right upper limb, including shoulder: Secondary | ICD-10-CM | POA: Diagnosis not present

## 2022-11-24 DIAGNOSIS — D224 Melanocytic nevi of scalp and neck: Secondary | ICD-10-CM | POA: Diagnosis not present

## 2022-11-24 DIAGNOSIS — L821 Other seborrheic keratosis: Secondary | ICD-10-CM | POA: Diagnosis not present

## 2022-11-24 DIAGNOSIS — D2262 Melanocytic nevi of left upper limb, including shoulder: Secondary | ICD-10-CM | POA: Diagnosis not present

## 2022-11-24 DIAGNOSIS — D2371 Other benign neoplasm of skin of right lower limb, including hip: Secondary | ICD-10-CM | POA: Diagnosis not present

## 2022-11-24 DIAGNOSIS — L661 Lichen planopilaris: Secondary | ICD-10-CM | POA: Diagnosis not present

## 2022-11-24 DIAGNOSIS — L718 Other rosacea: Secondary | ICD-10-CM | POA: Diagnosis not present

## 2022-12-15 DIAGNOSIS — I10 Essential (primary) hypertension: Secondary | ICD-10-CM | POA: Diagnosis not present

## 2023-06-17 IMAGING — DX DG KNEE STANDING AP BILAT
1 series · 1 of 1 positions shown · non-contrast
Comparison: None Available.

CLINICAL DATA: Bilateral knee pain.

EXAM:
BILATERAL KNEES STANDING - 1 VIEW

[knee ap]
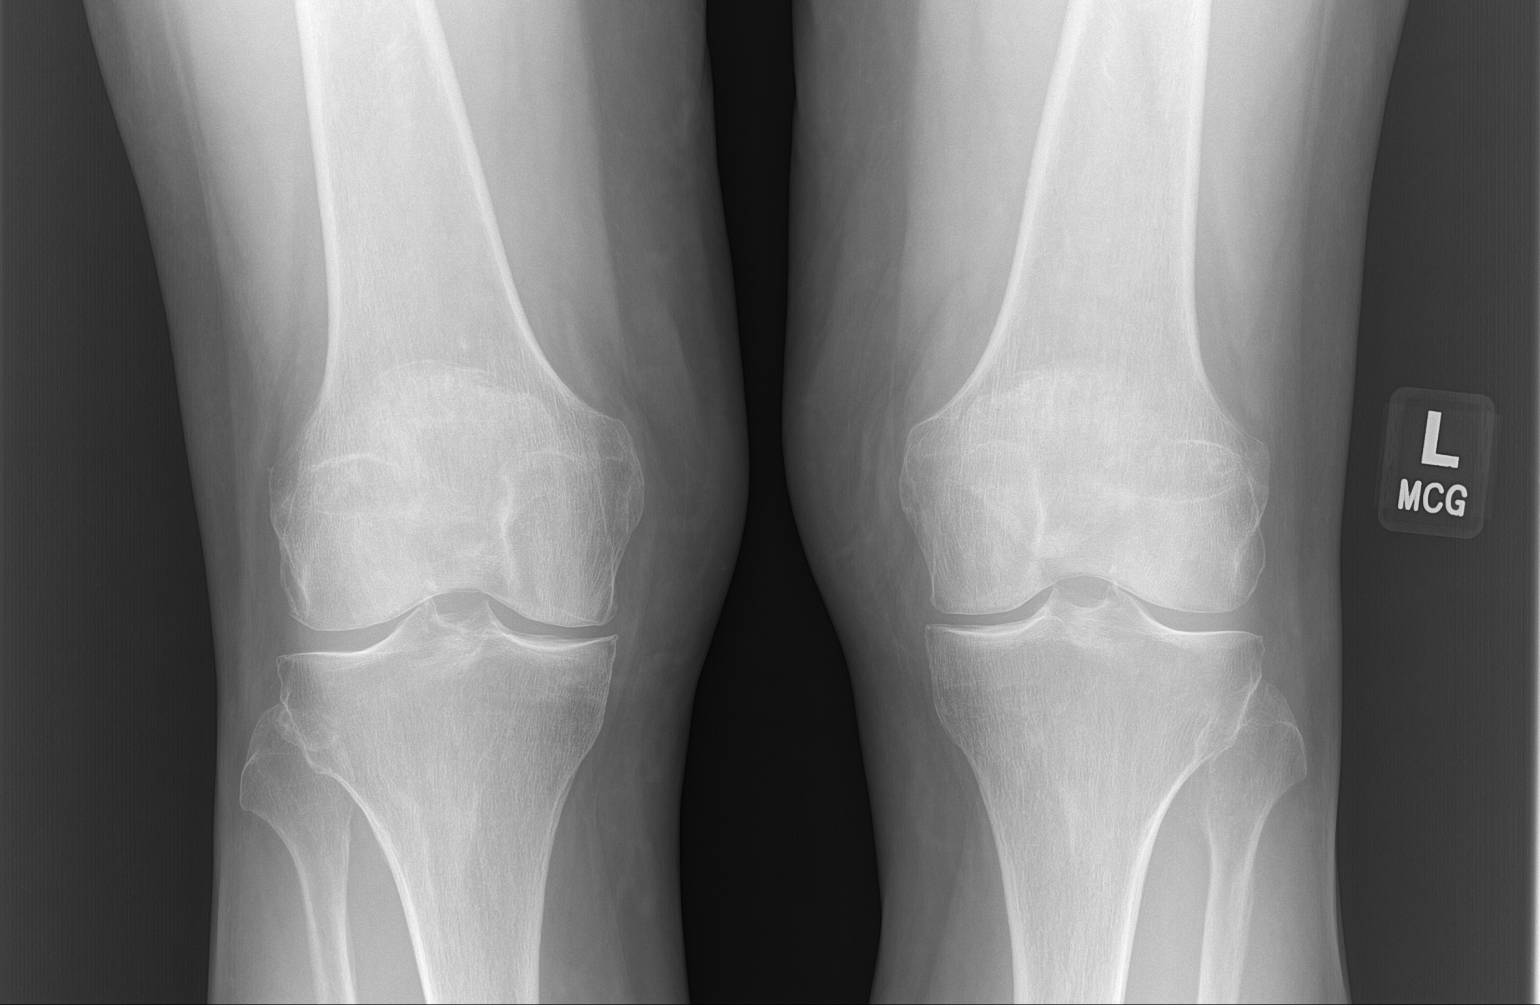

[1 of 1 positions shown; findings below may reference images not displayed]

FINDINGS: No evidence for fracture. Alignment appears anatomic. There is
minimal medial compartment joint space narrowing and osteophyte
formation bilaterally. No focal osseous lesion. Soft tissues are
within normal limits.
IMPRESSION: 1. No acute bony abnormality.
2. Minimal degenerative changes of the medial knees.

## 2023-06-28 DIAGNOSIS — E7800 Pure hypercholesterolemia, unspecified: Secondary | ICD-10-CM | POA: Diagnosis not present

## 2023-07-05 ENCOUNTER — Other Ambulatory Visit (HOSPITAL_COMMUNITY): Payer: Self-pay | Admitting: Family Medicine

## 2023-07-05 DIAGNOSIS — M810 Age-related osteoporosis without current pathological fracture: Secondary | ICD-10-CM | POA: Diagnosis not present

## 2023-07-05 DIAGNOSIS — E7800 Pure hypercholesterolemia, unspecified: Secondary | ICD-10-CM | POA: Diagnosis not present

## 2023-07-05 DIAGNOSIS — G4700 Insomnia, unspecified: Secondary | ICD-10-CM | POA: Diagnosis not present

## 2023-07-05 DIAGNOSIS — Z8249 Family history of ischemic heart disease and other diseases of the circulatory system: Secondary | ICD-10-CM

## 2023-07-05 DIAGNOSIS — Z1331 Encounter for screening for depression: Secondary | ICD-10-CM | POA: Diagnosis not present

## 2023-07-05 DIAGNOSIS — R202 Paresthesia of skin: Secondary | ICD-10-CM | POA: Diagnosis not present

## 2023-07-05 DIAGNOSIS — Z0000 Encounter for general adult medical examination without abnormal findings: Secondary | ICD-10-CM | POA: Diagnosis not present

## 2023-07-05 DIAGNOSIS — R001 Bradycardia, unspecified: Secondary | ICD-10-CM | POA: Diagnosis not present

## 2023-07-05 DIAGNOSIS — E559 Vitamin D deficiency, unspecified: Secondary | ICD-10-CM | POA: Diagnosis not present

## 2023-07-05 DIAGNOSIS — R293 Abnormal posture: Secondary | ICD-10-CM | POA: Diagnosis not present

## 2023-07-07 ENCOUNTER — Ambulatory Visit (HOSPITAL_COMMUNITY)
Admission: RE | Admit: 2023-07-07 | Discharge: 2023-07-07 | Disposition: A | Discharge status: Home / Self Care | Payer: PPO | Source: Ambulatory Visit | Attending: Family Medicine | Admitting: Family Medicine

## 2023-07-07 DIAGNOSIS — Z8249 Family history of ischemic heart disease and other diseases of the circulatory system: Secondary | ICD-10-CM | POA: Insufficient documentation

## 2023-07-19 ENCOUNTER — Ambulatory Visit
Admission: RE | Admit: 2023-07-19 | Discharge: 2023-07-19 | Disposition: A | Discharge status: Home / Self Care | Payer: PPO | Source: Ambulatory Visit | Attending: Family Medicine | Admitting: Family Medicine

## 2023-07-19 ENCOUNTER — Other Ambulatory Visit: Payer: Self-pay | Admitting: Family Medicine

## 2023-07-19 DIAGNOSIS — R918 Other nonspecific abnormal finding of lung field: Secondary | ICD-10-CM | POA: Diagnosis not present

## 2023-07-19 DIAGNOSIS — R9389 Abnormal findings on diagnostic imaging of other specified body structures: Secondary | ICD-10-CM

## 2023-08-04 DIAGNOSIS — R000 Tachycardia, unspecified: Secondary | ICD-10-CM | POA: Diagnosis not present

## 2023-08-15 DIAGNOSIS — H2513 Age-related nuclear cataract, bilateral: Secondary | ICD-10-CM | POA: Diagnosis not present

## 2023-08-15 DIAGNOSIS — D3132 Benign neoplasm of left choroid: Secondary | ICD-10-CM | POA: Diagnosis not present

## 2023-08-15 DIAGNOSIS — H5203 Hypermetropia, bilateral: Secondary | ICD-10-CM | POA: Diagnosis not present

## 2023-08-18 DIAGNOSIS — Z1231 Encounter for screening mammogram for malignant neoplasm of breast: Secondary | ICD-10-CM | POA: Diagnosis not present

## 2023-09-20 ENCOUNTER — Ambulatory Visit (HOSPITAL_BASED_OUTPATIENT_CLINIC_OR_DEPARTMENT_OTHER): Payer: PPO | Admitting: Cardiology

## 2023-11-29 DIAGNOSIS — L821 Other seborrheic keratosis: Secondary | ICD-10-CM | POA: Diagnosis not present

## 2023-11-29 DIAGNOSIS — D225 Melanocytic nevi of trunk: Secondary | ICD-10-CM | POA: Diagnosis not present

## 2023-11-29 DIAGNOSIS — D2261 Melanocytic nevi of right upper limb, including shoulder: Secondary | ICD-10-CM | POA: Diagnosis not present

## 2023-11-29 DIAGNOSIS — L718 Other rosacea: Secondary | ICD-10-CM | POA: Diagnosis not present

## 2023-11-29 DIAGNOSIS — L218 Other seborrheic dermatitis: Secondary | ICD-10-CM | POA: Diagnosis not present

## 2023-11-29 DIAGNOSIS — D2262 Melanocytic nevi of left upper limb, including shoulder: Secondary | ICD-10-CM | POA: Diagnosis not present

## 2024-01-12 ENCOUNTER — Ambulatory Visit: Payer: PPO | Admitting: Family Medicine

## 2024-03-02 ENCOUNTER — Encounter (INDEPENDENT_AMBULATORY_CARE_PROVIDER_SITE_OTHER): Payer: Self-pay | Admitting: Otolaryngology

## 2024-03-02 ENCOUNTER — Ambulatory Visit (INDEPENDENT_AMBULATORY_CARE_PROVIDER_SITE_OTHER): Payer: PPO | Admitting: Otolaryngology

## 2024-03-02 VITALS — BP 163/88 | HR 63 | Ht 68.0 in | Wt 132.0 lb

## 2024-03-02 DIAGNOSIS — J383 Other diseases of vocal cords: Secondary | ICD-10-CM

## 2024-03-02 DIAGNOSIS — R0982 Postnasal drip: Secondary | ICD-10-CM

## 2024-03-02 DIAGNOSIS — R490 Dysphonia: Secondary | ICD-10-CM

## 2024-03-02 DIAGNOSIS — R0981 Nasal congestion: Secondary | ICD-10-CM

## 2024-03-02 DIAGNOSIS — R053 Chronic cough: Secondary | ICD-10-CM

## 2024-03-02 DIAGNOSIS — K219 Gastro-esophageal reflux disease without esophagitis: Secondary | ICD-10-CM | POA: Diagnosis not present

## 2024-03-02 DIAGNOSIS — J3089 Other allergic rhinitis: Secondary | ICD-10-CM

## 2024-03-02 DIAGNOSIS — J343 Hypertrophy of nasal turbinates: Secondary | ICD-10-CM

## 2024-03-02 MED ORDER — CETIRIZINE HCL 10 MG PO TABS: 10.0000 mg | ORAL_TABLET | Freq: Every day | ORAL | 11 refills | Status: DC

## 2024-03-02 MED ORDER — LEVOCETIRIZINE DIHYDROCHLORIDE 5 MG PO TABS: 5.0000 mg | ORAL_TABLET | Freq: Every evening | ORAL | 9 refills | Status: AC

## 2024-03-02 MED ORDER — FLUTICASONE PROPIONATE 50 MCG/ACT NA SUSP: 2.0000 | Freq: Every day | NASAL | 6 refills | Status: AC

## 2024-03-02 NOTE — Patient Instructions (Signed)
-   Take Reflux Gourmet (natural supplement available on [employer]) to help with symptoms of chronic throat irritation      [employer]aming[employer]nl - check out this website to learn more about reflux   -Avoid lying down for at least two hours after a meal or after drinking acidic beverages, like soda, or other caffeinated beverages. This can help to prevent stomach contents from flowing back into the esophagus. -Keep your head elevated while you sleep. Using an extra pillow or two can also help to prevent reflux. -Eat smaller and more frequent meals each day instead of a few large meals. This promotes digestion and can aid in preventing heartburn. -Wear loose-fitting clothes to ease pressure on the stomach, which can worsen heartburn and reflux. -Reduce excess weight around the midsection. This can ease pressure on the stomach. Such pressure can force some stomach contents back up the esophagus

## 2024-03-02 NOTE — Progress Notes (Signed)
 ENT CONSULT:  Reason for Consult: intermittent hoarseness x 7-8 months  HPI: Discussed the use of AI scribe software for clinical note transcription with the patient, who gave verbal consent to proceed.  History of Present Illness   She presents with a voice complaint which started last summer.  She has been experiencing intermittent voice changes characterized by a hoarse cracking sounding' voice, first noticed around the summer. These changes have not been present for the last three weeks. No pain or discomfort when speaking, but she describes a sensation of 'trying to talk over something,' likening it to 'rocks in my throat.' No difficulty swallowing or breathing, and the changes occur throughout the day without following any specific event. No history of intubation or URI prior to sx onset.  She experiences constant nasal drainage, described as 'little clear drips' at the end of her nose, contributing to throat clearing. She has not been officially diagnosed with allergies but has taken Zyrtec in the past to manage typical allergy symptoms. However, she has not taken it much this year due to experiencing restless leg syndrome at night, which she associates with Zyrtec use.  She describes a dry cough that occurs when lying on the floor during yoga, which she finds unusual. The cough is non-productive and not associated with any recent illnesses such as a cold.  No history of heartburn or reflux symptoms. Never smoked.     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   APPENDECTOMY     BLADDER SUSPENSION     BUNIONECTOMY Right    LASIK Bilateral 2001   TONSILLECTOMY      Family History  Problem Relation Age of  Onset   Heart failure Mother    Kidney failure Mother    Thyroid cancer Mother    Hypertension Mother    Heart failure Father    Melanoma Father    Hypertension Father    Tremor Father    Osteoporosis Sister    Stroke Sister    Melanoma Sister    Other Sister        fibromuscular dysphasia of vertebral L carotid arteries   Heart attack Maternal Grandmother    Heart failure Maternal Grandmother    Melanoma Sister    Thyroid cancer Sister    Osteoporosis Sister    Colon cancer Neg Hx     Social History:  reports that she has never smoked. She has never used smokeless tobacco. She reports current alcohol use of about 7.0 standard drinks of alcohol per week. She reports that she does not use drugs.  Allergies:  Allergies  Allergen Reactions   Boniva [Ibandronate] Nausea Only   Codeine     REACTION: vomiting    Medications: I have reviewed the patient's current medications.  The PMH, PSH, Medications, Allergies, and SH were reviewed and updated.  ROS: Constitutional: Negative for fever, weight loss and weight gain. Cardiovascular: Negative for chest pain and dyspnea on exertion. Respiratory: Is not experiencing shortness of breath at rest. Gastrointestinal: Negative for nausea and vomiting. Neurological: Negative for headaches. Psychiatric: The patient is not nervous/anxious  Blood pressure (!) 163/88, pulse 63, height  5\' 8"  (1.727 m), weight 132 lb (59.9 kg), SpO2 99%. Body mass index is 20.07 kg/m.  PHYSICAL EXAM:  Exam: General: Well-developed, well-nourished Communication and Voice: Clear for the most part Respiratory Respiratory effort: Equal inspiration and expiration without stridor Cardiovascular Peripheral Vascular: Warm extremities with equal color/perfusion Eyes: No nystagmus with equal extraocular motion bilaterally Neuro/Psych/Balance: Patient oriented to person, place, and time; Appropriate mood and affect; Gait is intact with no imbalance; Cranial  nerves I-XII are intact Head and Face Inspection: Normocephalic and atraumatic without mass or lesion Palpation: Facial skeleton intact without bony stepoffs Salivary Glands: No mass or tenderness Facial Strength: Facial motility symmetric and full bilaterally ENT Pinna: External ear intact and fully developed External canal: Canal is patent with intact skin Tympanic Membrane: Clear and mobile External Nose: No scar or anatomic deformity Internal Nose: Septum is S-shaped and deviated to the left. No polyp, or purulence. Mucosal edema and erythema present.  Bilateral inferior turbinate hypertrophy.  Lips, Teeth, and gums: Mucosa and teeth intact and viable TMJ: No pain to palpation with full mobility Oral cavity/oropharynx: No erythema or exudate, no lesions present Nasopharynx: No mass or lesion with intact mucosa Hypopharynx: Intact mucosa without pooling of secretions Larynx Glottic: Full true vocal cord mobility without lesion or mass b/l VF atorphy near complete glottic closure  Supraglottic: Normal appearing epiglottis and AE folds Interarytenoid Space: Moderate pachydermia&edema Subglottic Space: Patent without lesion or edema Neck Neck and Trachea: Midline trachea without mass or lesion Thyroid: No mass or nodularity Lymphatics: No lymphadenopathy  Procedure: Preoperative diagnosis: dysphonia  Postoperative diagnosis:   Same + GERD LPR  Procedure: Flexible fiberoptic laryngoscopy  Surgeon: Ashok Croon, MD  Anesthesia: Topical lidocaine and Afrin Complications: None Condition is stable throughout exam  Indications and consent:  The patient presents to the clinic with above symptoms. Indirect laryngoscopy view was incomplete. Thus it was recommended that they undergo a flexible fiberoptic laryngoscopy. All of the risks, benefits, and potential complications were reviewed with the patient preoperatively and verbal informed consent was obtained.  Procedure: The  patient was seated upright in the clinic. Topical lidocaine and Afrin were applied to the nasal cavity. After adequate anesthesia had occurred, I then proceeded to pass the flexible telescope into the nasal cavity. The nasal cavity was patent without rhinorrhea or polyp. The nasopharynx was also patent without mass or lesion. The base of tongue was visualized and was normal. There were no signs of pooling of secretions in the piriform sinuses. The true vocal folds were mobile bilaterally. There were no signs of glottic or supraglottic mucosal lesion or mass. There was moderate interarytenoid pachydermia and post cricoid edema. The telescope was then slowly withdrawn and the patient tolerated the procedure throughout.   Studies Reviewed: CXR 07/07/23 IMPRESSION: No active cardiopulmonary disease.  No lung mass or nodule  Assessment/Plan: Encounter Diagnoses  Name Primary?   Dysphonia Yes   Chronic cough    Post-nasal drip    Environmental and seasonal allergies    Hypertrophy of both inferior nasal turbinates    Chronic nasal congestion    Glottic insufficiency    Age-related vocal fold atrophy    Chronic GERD     Assessment and Plan    Voice changes/Dysphonia x 7-8 months Intermittently raspy voice changes since summer. Flexible laryngoscopy shows healthy vocal cords with no masses or tumors but evidence of age-related VF atrophy, and findings c/w GERD LPR. Postnasal drainage present on scope exam as well, may contribute to throat  irritation. We discussed that sx are likely multi-factorial and I offered voice therapy and medical management of nasal congestion/PND and GERD LPR. She would like to hold off on therapy for now.  - Recommend Reflux Gourmet postprandially, especially at night, to manage reflux symptoms and prevent reflux-related dry cough and voice changes. - Advise lifestyle modifications: elevate head of bed, avoid trigger foods like caffeine and carbonated beverages. - Consider  future voice therapy if symptoms persist or worsen to optimize breath support.  GERD LPR -  Reflux Gourmet after meals - diet and lifestyle changes to minimize GERD - Refer to [employer] blog for dietary and lifestyle modifications/reflux cook book  Chronic nasal congestion and rhinorrhea/post-nasal drip Persistent clear nasal discharge likely due to vasomotor or allergic rhinitis, contributing to throat irritation and potential voice changes. Could be vasomotor rhinitis vs allergic rhinitis  - Consider Flonase once to twice daily for nasal symptoms. - Consider Xyzal at night for allergy symptoms, especially if Zyrtec causes restless leg syndrome.  - both sent to the pharmacy     Thank you for allowing me to participate in the care of this patient. Please do not hesitate to contact me with any questions or concerns.   Ashok Croon, MD Otolaryngology [facility] Phone: [phone] Fax: [phone]    03/02/2024, 8:25 AM

## 2024-04-16 DIAGNOSIS — M8588 Other specified disorders of bone density and structure, other site: Secondary | ICD-10-CM | POA: Diagnosis not present

## 2024-04-16 DIAGNOSIS — Z8262 Family history of osteoporosis: Secondary | ICD-10-CM | POA: Diagnosis not present

## 2024-04-16 DIAGNOSIS — L989 Disorder of the skin and subcutaneous tissue, unspecified: Secondary | ICD-10-CM | POA: Diagnosis not present

## 2024-04-23 ENCOUNTER — Other Ambulatory Visit: Payer: Self-pay | Admitting: Family Medicine

## 2024-04-23 DIAGNOSIS — L989 Disorder of the skin and subcutaneous tissue, unspecified: Secondary | ICD-10-CM

## 2024-05-01 ENCOUNTER — Ambulatory Visit
Admission: RE | Admit: 2024-05-01 | Discharge: 2024-05-01 | Disposition: A | Discharge status: Home / Self Care | Source: Ambulatory Visit | Attending: Family Medicine | Admitting: Family Medicine

## 2024-05-01 DIAGNOSIS — R222 Localized swelling, mass and lump, trunk: Secondary | ICD-10-CM | POA: Diagnosis not present

## 2024-05-01 DIAGNOSIS — L989 Disorder of the skin and subcutaneous tissue, unspecified: Secondary | ICD-10-CM

## 2024-05-01 DIAGNOSIS — R2241 Localized swelling, mass and lump, right lower limb: Secondary | ICD-10-CM | POA: Diagnosis not present

## 2024-07-03 DIAGNOSIS — E7800 Pure hypercholesterolemia, unspecified: Secondary | ICD-10-CM | POA: Diagnosis not present

## 2024-07-03 DIAGNOSIS — E559 Vitamin D deficiency, unspecified: Secondary | ICD-10-CM | POA: Diagnosis not present

## 2024-07-10 DIAGNOSIS — E7800 Pure hypercholesterolemia, unspecified: Secondary | ICD-10-CM | POA: Diagnosis not present

## 2024-07-10 DIAGNOSIS — Z0000 Encounter for general adult medical examination without abnormal findings: Secondary | ICD-10-CM | POA: Diagnosis not present

## 2024-07-10 DIAGNOSIS — M810 Age-related osteoporosis without current pathological fracture: Secondary | ICD-10-CM | POA: Diagnosis not present

## 2024-07-10 DIAGNOSIS — Z1331 Encounter for screening for depression: Secondary | ICD-10-CM | POA: Diagnosis not present

## 2024-08-16 DIAGNOSIS — H524 Presbyopia: Secondary | ICD-10-CM | POA: Diagnosis not present

## 2024-08-16 DIAGNOSIS — H2513 Age-related nuclear cataract, bilateral: Secondary | ICD-10-CM | POA: Diagnosis not present

## 2024-08-23 DIAGNOSIS — Z1231 Encounter for screening mammogram for malignant neoplasm of breast: Secondary | ICD-10-CM | POA: Diagnosis not present

## 2024-11-29 DIAGNOSIS — D2261 Melanocytic nevi of right upper limb, including shoulder: Secondary | ICD-10-CM | POA: Diagnosis not present

## 2024-11-29 DIAGNOSIS — D225 Melanocytic nevi of trunk: Secondary | ICD-10-CM | POA: Diagnosis not present

## 2024-11-29 DIAGNOSIS — L6611 Classic lichen planopilaris: Secondary | ICD-10-CM | POA: Diagnosis not present

## 2024-11-29 DIAGNOSIS — D2262 Melanocytic nevi of left upper limb, including shoulder: Secondary | ICD-10-CM | POA: Diagnosis not present

## 2024-11-29 DIAGNOSIS — L821 Other seborrheic keratosis: Secondary | ICD-10-CM | POA: Diagnosis not present

## 2025-01-01 NOTE — Progress Notes (Unsigned)
 " [facility] [address] [place] [address] Phone: [phone] Subjective:   LILLETTE Berwyn Posey, am serving as a scribe for Dr. Arthea Claudene.  I'm seeing this patient by the request  of:  Claudene Pellet, MD  CC: Left hip pain  YEP:[id number]  07/21/2022 Patient has no significant synovitis at the moment.  Seems to be responding really well to the conservative changes.  No significant arthritic changes noted on x-rays either.  Patient hopefully will continue to respond well to the conservative therapy and follow-up with me as needed     Updated 01/02/2025 Jenita Rayfield is a 74 y.o. female coming in with complaint of hip pain on the left side.  Has been seen previously for spinal stenosis of the lumbar spine. Pain in hip started 2 years ago. Pain started with her not being able to sleep on her side. Pain is stinging and burning over trochanteric bursae. Used ab and ad machine at [facility] and this exacerbated her pain. Yoga can also increase her pain. Wants a plan for activity.       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   APPENDECTOMY     BLADDER SUSPENSION     BUNIONECTOMY Right    LASIK Bilateral 2001   TONSILLECTOMY     Social History   Socioeconomic History   Marital status: Married    Spouse name: Oneil   Number of children: 2   Years of education: Not on file   Highest education level: Master's degree (e.g., MA, MS, MEng, MEd, MSW, MBA)  Occupational History    Comment: school teacher  Tobacco Use   Smoking status: Never   Smokeless tobacco: Never  Substance and Sexual Activity   Alcohol use: Yes    Alcohol/week: 7.0 standard drinks of alcohol    Types: 7  Glasses of wine per week    Comment: 1 glass wine daily   Drug use: No   Sexual activity: Not on file  Other Topics Concern   Not on file  Social History Narrative   Lives with husband   Caffeine- coffee 2 daily   Social Drivers of Health   Tobacco Use: Low Risk (03/02/2024)   Patient History    Smoking Tobacco Use: Never    Smokeless Tobacco Use: Never    Passive Exposure: Not on file  Financial Resource Strain: Not on file  Food Insecurity: Not on file  Transportation Needs: Not on file  Physical Activity: Not on file  Stress: Not on file  Social Connections: Not on file  Depression (EYV7-0): Not on file  Alcohol Screen: Not on file  Housing: Not on file  Utilities: Not on file  Health Literacy: Not on file   Allergies[1] Family History  Problem Relation Age of Onset   Heart failure Mother    Kidney failure Mother    Thyroid  cancer Mother    Hypertension Mother    Heart failure Father    Melanoma Father    Hypertension Father    Tremor Father    Osteoporosis Sister    Stroke Sister    Melanoma Sister  Other Sister        fibromuscular dysphasia of vertebral L carotid arteries   Heart attack Maternal Grandmother    Heart failure Maternal Grandmother    Melanoma Sister    Thyroid  cancer Sister    Osteoporosis Sister    Colon cancer Neg Hx     Current Outpatient Medications (Respiratory):    fluticasone  (FLONASE ) 50 MCG/ACT nasal spray, Place 2 sprays into both nostrils daily.   levocetirizine (XYZAL  ALLERGY 24HR) 5 MG tablet, Take 1 tablet (5 mg total) by mouth every evening.  Current Outpatient Medications (Hematological):    IRON CR PO, Take 65 mg by mouth. 2-3 x weekly  Current Outpatient Medications (Other):    Calcium Carbonate-Vit D-Min (CALCIUM 1200 PO), Take 1,200 Units by mouth daily.   Cholecalciferol (D3) 50 MCG (2000 UT) TABS, Take 1 tablet by mouth daily.   clonazePAM (KLONOPIN) 0.5 MG tablet, Take 0.5 mg by mouth 2 (two) times daily as  needed for anxiety.   Multiple Vitamins-Minerals (OSTEOPRIME ULTRA PO), Take by mouth.   vitamin C (ASCORBIC ACID) 500 MG tablet, Take 500 mg by mouth daily.   Reviewed prior external information including notes and imaging from  primary care provider As well as notes that were available from care everywhere and other healthcare systems.  Past medical history, social, surgical and family history all reviewed in electronic medical record.  No pertanent information unless stated regarding to the chief complaint.   Review of Systems:  No headache, visual changes, nausea, vomiting, diarrhea, constipation, dizziness, abdominal pain, skin rash, fevers, chills, night sweats, weight loss, swollen lymph nodes, body aches, joint swelling, chest pain, shortness of breath, mood changes. POSITIVE muscle aches  Objective  Blood pressure (!) 132/92, pulse 67, height 5' 8 (1.727 m), weight 130 lb (59 kg), SpO2 99%.   General: No apparent distress alert and oriented x3 mood and affect normal, dressed appropriately.  HEENT: Pupils equal, extraocular movements intact  Respiratory: Patient's speak in full sentences and does not appear short of breath  Cardiovascular: No lower extremity edema, non tender, no erythema  Left hip exam shows relatively good range of motion but does have more tightness on the left than the right with FABER test.  Tender to palpation over the greater trochanteric area.   Procedure: Real-time Ultrasound Guided Injection of left gluteal tendon sheath Device: GE Logiq Q7 Ultrasound guided injection is preferred based studies that show increased duration, increased effect, greater accuracy, decreased procedural pain, increased response rate, and decreased cost with ultrasound guided versus blind injection.  Verbal informed consent obtained.  Time-out conducted.  Noted no overlying erythema, induration, or other signs of local infection.  Skin prepped in a sterile fashion.  Local  anesthesia: Topical Ethyl chloride.  With sterile technique and under real time ultrasound guidance: With a 21-gauge 2 inch needle injected with 0.5 cc of 0.5% Marcaine and 1 cc of Kenalog 40 mg/mL into the tendon sheath where there was scar tissue formation. Completed without difficulty  Pain immediately resolved suggesting accurate placement of the medication.  Advised to call if fevers/chills, erythema, induration, drainage, or persistent bleeding.  Impression: Technically successful ultrasound guided injection. Impression and Recommendations:    The above documentation has been reviewed and is accurate and complete Arthea CHRISTELLA Sharps, DO        [1]  Allergies Allergen Reactions   Boniva [Ibandronate] Nausea Only   Codeine     REACTION: vomiting   "

## 2025-01-02 ENCOUNTER — Ambulatory Visit: Payer: Self-pay

## 2025-01-02 ENCOUNTER — Ambulatory Visit: Admitting: Family Medicine

## 2025-01-02 ENCOUNTER — Encounter: Payer: Self-pay | Admitting: Family Medicine

## 2025-01-02 ENCOUNTER — Ambulatory Visit

## 2025-01-02 VITALS — BP 132/92 | HR 67 | Ht 68.0 in | Wt 130.0 lb

## 2025-01-02 DIAGNOSIS — M25552 Pain in left hip: Secondary | ICD-10-CM

## 2025-01-02 DIAGNOSIS — S76012A Strain of muscle, fascia and tendon of left hip, initial encounter: Secondary | ICD-10-CM

## 2025-01-02 DIAGNOSIS — S76019A Strain of muscle, fascia and tendon of unspecified hip, initial encounter: Secondary | ICD-10-CM | POA: Insufficient documentation

## 2025-01-02 DIAGNOSIS — M545 Low back pain, unspecified: Secondary | ICD-10-CM

## 2025-01-02 NOTE — Assessment & Plan Note (Signed)
 Patient given injection and tolerated the procedure well, discussed icing regimen and home exercises, discussed which activities to do and which ones to avoid.  Increase activity slowly.  Discussed icing regimen.  Follow-up again in 6 to 12 weeks

## 2025-01-02 NOTE — Patient Instructions (Addendum)
 Xray today Exercises Decrease weight and intensity 25% and increase by 10% each week Injection today See you again in 2 months

## 2025-01-30 ENCOUNTER — Ambulatory Visit: Admitting: Family Medicine

## 2025-03-04 ENCOUNTER — Ambulatory Visit: Admitting: Family Medicine
